# Patient Record
Sex: Male | Born: 1975 | Hispanic: No | Marital: Married | Smoking: Never smoker
Health system: Southern US, Community
[De-identification: ages and names within clinical notes are randomized; demographics above are authoritative.]

## PROBLEM LIST (undated history)

## (undated) DIAGNOSIS — T753XXA Motion sickness, initial encounter: Secondary | ICD-10-CM

## (undated) HISTORY — PX: NO PAST SURGERIES: SHX2092

---

## 2002-06-13 ENCOUNTER — Encounter: Payer: Self-pay | Admitting: Specialist

## 2002-06-13 ENCOUNTER — Encounter: Admission: RE | Admit: 2002-06-13 | Discharge: 2002-06-13 | Payer: Self-pay | Admitting: Specialist

## 2008-06-23 ENCOUNTER — Ambulatory Visit: Payer: Self-pay | Admitting: Internal Medicine

## 2015-08-10 ENCOUNTER — Encounter: Payer: Self-pay | Admitting: *Deleted

## 2015-08-16 NOTE — Discharge Instructions (Signed)
[facility] °[facility] °ENDOSCOPIC SINUS SURGERY °[facility] ° °What is Functional Endoscopic Sinus Surgery? ° The Surgery involves making the natural openings of the sinuses larger by removing the bony partitions that separate the sinuses from the nasal cavity.  The natural sinus lining is preserved as much as possible to allow the sinuses to resume normal function after the surgery.  In some patients nasal polyps (excessively swollen lining of the sinuses) may be removed to relieve obstruction of the sinus openings.  The surgery is performed through the nose using lighted scopes, which eliminates the need for incisions on the face.  A septoplasty is a different procedure which is sometimes performed with sinus surgery.  It involves straightening the boy partition that separates the two sides of your nose.  A crooked or deviated septum may need repair if is obstructing the sinuses or nasal airflow.  Turbinate reduction is also often performed during sinus surgery.  The turbinates are bony proturberances from the side walls of the nose which swell and can obstruct the nose in patients with sinus and allergy problems.  Their size can be surgically reduced to help relieve nasal obstruction. ° °What Can Sinus Surgery Do For Me? ° Sinus surgery can reduce the frequency of sinus infections requiring antibiotic treatment.  This can provide improvement in nasal congestion, post-nasal drainage, facial pressure and nasal obstruction.  Surgery will NOT prevent you from ever having an infection again, so it usually only for patients who get infections 4 or more times yearly requiring antibiotics, or for infections that do not clear with antibiotics.  It will not cure nasal allergies, so patients with allergies may still require medication to treat their allergies after surgery. Surgery may improve headaches related to sinusitis, however, some people will continue to  require medication to control sinus headaches related to allergies.  Surgery will do nothing for other forms of headache (migraine, tension or cluster). ° °What Are the Risks of Endoscopic Sinus Surgery? ° Current techniques allow surgery to be performed safely with little risk, however, there are rare complications that patients should be aware of.  Because the sinuses are located around the eyes, there is risk of eye injury, including blindness, though again, this would be quite rare. This is usually a result of bleeding behind the eye during surgery, which puts the vision oat risk, though there are treatments to protect the vision and prevent permanent disrupted by surgery causing a leak of the spinal fluid that surrounds the brain.  More serious complications would include bleeding inside the brain cavity or damage to the brain.  Again, all of these complications are uncommon, and spinal fluid leaks can be safely managed surgically if they occur.  The most common complication of sinus surgery is bleeding from the nose, which may require packing or cauterization of the nose.  Continued sinus have polyps may experience recurrence of the polyps requiring revision surgery.  Alterations of sense of smell or injury to the tear ducts are also rare complications.  ° °What is the Surgery Like, and what is the Recovery? ° The Surgery usually takes a couple of hours to perform, and is usually performed under a general anesthetic (completely asleep).  Patients are usually discharged home after a couple of hours.  Sometimes during surgery it is necessary to pack the nose to control bleeding, and the packing is left in place for 24 - 48 hours, and removed by your surgeon.    If a septoplasty was performed during the procedure, there is often a splint placed which must be removed after 5-7 days.   °Discomfort: Pain is usually mild to moderate, and can be controlled by prescription pain medication or acetaminophen (Tylenol).   Aspirin, Ibuprofen (Advil, Motrin), or Naprosyn (Aleve) should be avoided, as they can cause increased bleeding.  Most patients feel sinus pressure like they have a bad head cold for several days.  Sleeping with your head elevated can help reduce swelling and facial pressure, as can ice packs over the face.  A humidifier may be helpful to keep the mucous and blood from drying in the nose.  ° °Diet: There are no specific diet restrictions, however, you should generally start with clear liquids and a light diet of bland foods because the anesthetic can cause some nausea.  Advance your diet depending on how your stomach feels.  Taking your pain medication with food will often help reduce stomach upset which pain medications can cause. ° °Nasal Saline Irrigation: It is important to remove blood clots and dried mucous from the nose as it is healing.  This is done by having you irrigate the nose at least 3 - 4 times daily with a salt water solution.  We recommend using NeilMed Sinus Rinse (available at the drug store).  Fill the squeeze bottle with the solution, bend over a sink, and insert the tip of the squeeze bottle into the nose ½ of an inch.  Point the tip of the squeeze bottle towards the inside corner of the eye on the same side your irrigating.  Squeeze the bottle and gently irrigate the nose.  If you bend forward as you do this, most of the fluid will flow back out of the nose, instead of down your throat.   The solution should be warm, near body temperature, when you irrigate.   Each time you irrigate, you should use a full squeeze bottle.  ° °Note that if you are instructed to use Nasal Steroid Sprays at any time after your surgery, irrigate with saline BEFORE using the steroid spray, so you do not wash it all out of the nose. °Another product, Nasal Saline Gel (such as AYR Nasal Saline Gel) can be applied in each nostril 3 - 4 times daily to moisture the nose and reduce scabbing or crusting. ° °Bleeding:   Bloody drainage from the nose can be expected for several days, and patients are instructed to irrigate their nose frequently with salt water to help remove mucous and blood clots.  The drainage may be dark red or brown, though some fresh blood may be seen intermittently, especially after irrigation.  Do not blow you nose, as bleeding may occur. If you must sneeze, keep your mouth open to allow air to escape through your mouth. ° °If heavy bleeding occurs: Irrigate the nose with saline to rinse out clots, then spray the nose 3 - 4 times with Afrin Nasal Decongestant Spray.  The spray will constrict the blood vessels to slow bleeding.  Pinch the lower half of your nose shut to apply pressure, and lay down with your head elevated.  Ice packs over the nose may help as well. If bleeding persists despite these measures, you should notify your doctor.  Do not use the Afrin routinely to control nasal congestion after surgery, as it can result in worsening congestion and may affect healing.  ° ° ° °Activity: Return to work varies among patients. Most patients will be   out of work at least 5 - 7 days to recover.  Patient may return to work after they are off of narcotic pain medication, and feeling well enough to perform the functions of their job.  Patients must avoid heavy lifting (over 10 pounds) or strenuous physical for 2 weeks after surgery, so your employer may need to assign you to light duty, or keep you out of work longer if light duty is not possible.  NOTE: you should not drive, operate dangerous machinery, do any mentally demanding tasks or make any important legal or financial decisions while on narcotic pain medication and recovering from the general anesthetic.  °  °Call Your Doctor Immediately if You Have Any of the Following: °1. Bleeding that you cannot control with the above measures °2. Loss of vision, double vision, bulging of the eye or black eyes. °3. Fever over 101 degrees °4. Neck stiffness with  severe headache, fever, nausea and change in mental state. °You are always encourage to call anytime with concerns, however, please call with requests for pain medication refills during office hours. ° °Office Endoscopy: During follow-up visits your doctor will remove any packing or splints that may have been placed and evaluate and clean your sinuses endoscopically.  Topical anesthetic will be used to make this as comfortable as possible, though you may want to take your pain medication prior to the visit.  How often this will need to be done varies from patient to patient.  After complete recovery from the surgery, you may need follow-up endoscopy from time to time, particularly if there is concern of recurrent infection or nasal polyps. ° °General Anesthesia, Adult, Care After °Refer to this sheet in the next few weeks. These instructions provide you with information on caring for yourself after your procedure. Your health care provider may also give you more specific instructions. Your treatment has been planned according to current medical practices, but problems sometimes occur. Call your health care provider if you have any problems or questions after your procedure. °WHAT TO EXPECT AFTER THE PROCEDURE °After the procedure, it is typical to experience: °· Sleepiness. °· Nausea and vomiting. °HOME CARE INSTRUCTIONS °· For the first 24 hours after general anesthesia: °¨ Have a responsible person with you. °¨ Do not drive a car. If you are alone, do not take public transportation. °¨ Do not drink alcohol. °¨ Do not take medicine that has not been prescribed by your health care provider. °¨ Do not sign important papers or make important decisions. °¨ You may resume a normal diet and activities as directed by your health care provider. °· Change bandages (dressings) as directed. °· If you have questions or problems that seem related to general anesthesia, call the hospital and ask for the anesthetist or  anesthesiologist on call. °SEEK MEDICAL CARE IF: °· You have nausea and vomiting that continue the day after anesthesia. °· You develop a rash. °SEEK IMMEDIATE MEDICAL CARE IF:  °· You have difficulty breathing. °· You have chest pain. °· You have any allergic problems. °  °This information is not intended to replace advice given to you by your health care provider. Make sure you discuss any questions you have with your health care provider. °  °Document Released: 07/14/2000 Document Revised: 04/28/2014 Document Reviewed: 08/06/2011 °Elsevier Interactive Patient Education ©2016 [employer] ° °

## 2015-08-17 ENCOUNTER — Ambulatory Visit: Payer: Managed Care, Other (non HMO) | Admitting: Anesthesiology

## 2015-08-17 ENCOUNTER — Encounter
Admission: RE | Disposition: A | Discharge status: Home / Self Care | Payer: Self-pay | Source: Ambulatory Visit | Attending: Unknown Physician Specialty

## 2015-08-17 ENCOUNTER — Encounter: Payer: Self-pay | Admitting: *Deleted

## 2015-08-17 ENCOUNTER — Ambulatory Visit
Admission: RE | Admit: 2015-08-17 | Discharge: 2015-08-17 | Disposition: A | Discharge status: Home / Self Care | Payer: Managed Care, Other (non HMO) | Source: Ambulatory Visit | Attending: Unknown Physician Specialty | Admitting: Unknown Physician Specialty

## 2015-08-17 DIAGNOSIS — J343 Hypertrophy of nasal turbinates: Secondary | ICD-10-CM | POA: Diagnosis not present

## 2015-08-17 DIAGNOSIS — J321 Chronic frontal sinusitis: Secondary | ICD-10-CM | POA: Diagnosis not present

## 2015-08-17 DIAGNOSIS — J309 Allergic rhinitis, unspecified: Secondary | ICD-10-CM | POA: Diagnosis not present

## 2015-08-17 DIAGNOSIS — J342 Deviated nasal septum: Secondary | ICD-10-CM | POA: Insufficient documentation

## 2015-08-17 DIAGNOSIS — J320 Chronic maxillary sinusitis: Secondary | ICD-10-CM | POA: Diagnosis not present

## 2015-08-17 DIAGNOSIS — J322 Chronic ethmoidal sinusitis: Secondary | ICD-10-CM | POA: Diagnosis not present

## 2015-08-17 DIAGNOSIS — J329 Chronic sinusitis, unspecified: Secondary | ICD-10-CM | POA: Diagnosis present

## 2015-08-17 DIAGNOSIS — J323 Chronic sphenoidal sinusitis: Secondary | ICD-10-CM | POA: Insufficient documentation

## 2015-08-17 HISTORY — PX: SPHENOIDECTOMY: SHX2421

## 2015-08-17 HISTORY — PX: MAXILLARY ANTROSTOMY: SHX2003

## 2015-08-17 HISTORY — PX: SEPTOPLASTY: SHX2393

## 2015-08-17 HISTORY — PX: IMAGE GUIDED SINUS SURGERY: SHX6570

## 2015-08-17 HISTORY — DX: Motion sickness, initial encounter: T75.3XXA

## 2015-08-17 HISTORY — PX: NASAL TURBINATE REDUCTION: SHX2072

## 2015-08-17 HISTORY — PX: FRONTAL SINUS EXPLORATION: SHX6591

## 2015-08-17 HISTORY — PX: ETHMOIDECTOMY: SHX5197

## 2015-08-17 SURGERY — SEPTOPLASTY, NOSE
Anesthesia: General

## 2015-08-17 MED ORDER — SULFAMETHOXAZOLE-TRIMETHOPRIM 800-160 MG PO TABS
1.0000 | ORAL_TABLET | Freq: Two times a day (BID) | ORAL | Status: DC
Start: 2015-08-17 — End: 2017-02-10

## 2015-08-17 MED ORDER — DEXAMETHASONE SODIUM PHOSPHATE 4 MG/ML IJ SOLN
INTRAMUSCULAR | Status: DC | PRN
  Administered 2015-08-17: 10 mg via INTRAVENOUS

## 2015-08-17 MED ORDER — MIDAZOLAM HCL 5 MG/5ML IJ SOLN
INTRAMUSCULAR | Status: DC | PRN
  Administered 2015-08-17: 2 mg via INTRAVENOUS

## 2015-08-17 MED ORDER — PROMETHAZINE HCL 25 MG/ML IJ SOLN
6.2500 mg | INTRAMUSCULAR | Status: DC | PRN
Start: 2015-08-17 — End: 2015-08-17

## 2015-08-17 MED ORDER — LACTATED RINGERS IV SOLN
INTRAVENOUS | Status: DC
  Administered 2015-08-17 (×2): via INTRAVENOUS

## 2015-08-17 MED ORDER — OXYMETAZOLINE HCL 0.05 % NA SOLN
6.0000 | Freq: Once | NASAL | Status: AC
  Administered 2015-08-17: 6 via NASAL

## 2015-08-17 MED ORDER — ACETAMINOPHEN 10 MG/ML IV SOLN
1000.0000 mg | Freq: Once | INTRAVENOUS | Status: AC
  Administered 2015-08-17: 1000 mg via INTRAVENOUS

## 2015-08-17 MED ORDER — SCOPOLAMINE 1 MG/3DAYS TD PT72
1.0000 | MEDICATED_PATCH | TRANSDERMAL | Status: DC
  Administered 2015-08-17: 1.5 mg via TRANSDERMAL

## 2015-08-17 MED ORDER — MEPERIDINE HCL 25 MG/ML IJ SOLN: 6.2500 mg | INTRAMUSCULAR | Status: DC | PRN

## 2015-08-17 MED ORDER — ONDANSETRON HCL 4 MG/2ML IJ SOLN
INTRAMUSCULAR | Status: DC | PRN
  Administered 2015-08-17: 4 mg via INTRAVENOUS

## 2015-08-17 MED ORDER — ROCURONIUM BROMIDE 100 MG/10ML IV SOLN
INTRAVENOUS | Status: DC | PRN
  Administered 2015-08-17: 30 mg via INTRAVENOUS

## 2015-08-17 MED ORDER — OXYCODONE HCL 5 MG PO TABS
5.0000 mg | ORAL_TABLET | Freq: Once | ORAL | Status: AC | PRN
  Administered 2015-08-17: 5 mg via ORAL

## 2015-08-17 MED ORDER — OXYCODONE-ACETAMINOPHEN 5-325 MG PO TABS: 1.0000 | ORAL_TABLET | ORAL | Status: DC | PRN

## 2015-08-17 MED ORDER — LIDOCAINE HCL (CARDIAC) 20 MG/ML IV SOLN
INTRAVENOUS | Status: DC | PRN
  Administered 2015-08-17: 40 mg via INTRAVENOUS

## 2015-08-17 MED ORDER — OXYCODONE HCL 5 MG/5ML PO SOLN: 5.0000 mg | Freq: Once | ORAL | Status: AC | PRN

## 2015-08-17 MED ORDER — GLYCOPYRROLATE 0.2 MG/ML IJ SOLN
INTRAMUSCULAR | Status: DC | PRN
  Administered 2015-08-17: 0.1 mg via INTRAVENOUS

## 2015-08-17 MED ORDER — FENTANYL CITRATE (PF) 100 MCG/2ML IJ SOLN
INTRAMUSCULAR | Status: DC | PRN
  Administered 2015-08-17: 100 ug via INTRAVENOUS

## 2015-08-17 MED ORDER — HYDROMORPHONE HCL 1 MG/ML IJ SOLN: 0.2500 mg | INTRAMUSCULAR | Status: DC | PRN

## 2015-08-17 MED ORDER — PROPOFOL 10 MG/ML IV BOLUS
INTRAVENOUS | Status: DC | PRN
  Administered 2015-08-17: 20 mg via INTRAVENOUS
  Administered 2015-08-17: 150 mg via INTRAVENOUS

## 2015-08-17 SURGICAL SUPPLY — 35 items
BATTERY INSTRU NAVIGATION (MISCELLANEOUS) ×16 IMPLANT
BLADE SURG 15 STRL LF DISP TIS (BLADE) IMPLANT
BLADE SURG 15 STRL SS (BLADE)
BTRY SRG DRVR LF (MISCELLANEOUS) ×4
CANISTER SUCT 1200ML W/VALVE (MISCELLANEOUS) ×4 IMPLANT
COAG SUCT 10F 3.5MM HAND CTRL (MISCELLANEOUS) ×4 IMPLANT
CUP MEDICINE 2OZ PLAST GRAD ST (MISCELLANEOUS) ×4 IMPLANT
DRAPE HEAD BAR (DRAPES) ×4 IMPLANT
DRESSING NASL FOAM PST OP SINU (MISCELLANEOUS) ×4 IMPLANT
DRSG NASAL FOAM POST OP SINU (MISCELLANEOUS) ×8
GLOVE BIO SURGEON STRL SZ7.5 (GLOVE) ×8 IMPLANT
HANDLE YANKAUER SUCT BULB TIP (MISCELLANEOUS) ×4 IMPLANT
KIT ROOM TURNOVER OR (KITS) ×4 IMPLANT
NAVIGATION MASK REG  ST (MISCELLANEOUS) ×4 IMPLANT
NEEDLE HYPO 25GX1X1/2 BEV (NEEDLE) ×4 IMPLANT
NS IRRIG 500ML POUR BTL (IV SOLUTION) ×4 IMPLANT
PACK DRAPE NASAL/ENT (PACKS) ×4 IMPLANT
PAD GROUND ADULT SPLIT (MISCELLANEOUS) ×4 IMPLANT
SOL ANTI-FOG 6CC FOG-OUT (MISCELLANEOUS) ×2 IMPLANT
SOL FOG-OUT ANTI-FOG 6CC (MISCELLANEOUS) ×2
SPLINT NASAL SEPTAL BLV .25 LG (MISCELLANEOUS) ×4 IMPLANT
SPLINT NASAL SEPTAL BLV .50 ST (MISCELLANEOUS) ×4 IMPLANT
SPONGE NEURO XRAY DETECT 1X3 (DISPOSABLE) ×4 IMPLANT
STRAP BODY AND KNEE 60X3 (MISCELLANEOUS) ×4 IMPLANT
SUT CHROMIC 3-0 (SUTURE) ×3
SUT CHROMIC 3-0 KS 27XMFL CR (SUTURE) ×2
SUT CHROMIC 5-0 (SUTURE)
SUT CHROMIC 5-0 P2 18XMFL CR (SUTURE)
SUT ETHILON 3-0 KS 30 BLK (SUTURE) ×4 IMPLANT
SUT PLAIN GUT 4-0 (SUTURE) IMPLANT
SUTURE CHRMC 3-0 KS 27XMFL CR (SUTURE) ×2 IMPLANT
SUTURE CHRMC 5-0 P2 18XMF CR (SUTURE) IMPLANT
SYRINGE 10CC LL (SYRINGE) ×4 IMPLANT
TOWEL OR 17X26 4PK STRL BLUE (TOWEL DISPOSABLE) ×4 IMPLANT
WATER STERILE IRR 500ML POUR (IV SOLUTION) ×4 IMPLANT

## 2015-08-17 NOTE — Anesthesia Postprocedure Evaluation (Signed)
Anesthesia Post Note  Patient: Ethan Jackson  Procedure(s) Performed: Procedure(s) (LRB): SEPTOPLASTY (N/A) TURBINATE REDUCTION/SUBMUCOSAL RESECTION (Bilateral) ANT/POSTERIOR ETHMOIDECTOMY (Bilateral) MAXILLARY ANTROSTOMY (Bilateral) FRONTAL SINUS EXPLORATION (Bilateral) SPHENOIDECTOMY (Bilateral) IMAGE GUIDED SINUS SURGERY (N/A)  Patient location during evaluation: PACU Anesthesia Type: General Level of consciousness: awake and alert and oriented Pain management: pain level controlled Vital Signs Assessment: post-procedure vital signs reviewed and stable Respiratory status: spontaneous breathing and nonlabored ventilation Cardiovascular status: stable Postop Assessment: no signs of nausea or vomiting and adequate PO intake Anesthetic complications: no    Estill Batten

## 2015-08-17 NOTE — H&P (Signed)
  H+P  Reviewed and will be scanned in later. No changes noted. 

## 2015-08-17 NOTE — Transfer of Care (Signed)
Immediate Anesthesia Transfer of Care Note  Patient: Ethan Jackson  Procedure(s) Performed: Procedure(s) with comments: SEPTOPLASTY (N/A) - DISK GAVE DISK TO TABITHA ON 4-06 KP TURBINATE REDUCTION/SUBMUCOSAL RESECTION (Bilateral) ANT/POSTERIOR ETHMOIDECTOMY (Bilateral) MAXILLARY ANTROSTOMY (Bilateral) FRONTAL SINUS EXPLORATION (Bilateral) SPHENOIDECTOMY (Bilateral) IMAGE GUIDED SINUS SURGERY (N/A)  Patient Location: PACU  Anesthesia Type: General  Level of Consciousness: awake, alert  and patient cooperative  Airway and Oxygen Therapy: Patient Spontanous Breathing and Patient connected to supplemental oxygen  Post-op Assessment: Post-op Vital signs reviewed, Patient's Cardiovascular Status Stable, Respiratory Function Stable, Patent Airway and No signs of Nausea or vomiting  Post-op Vital Signs: Reviewed and stable  Complications: No apparent anesthesia complications

## 2015-08-17 NOTE — Op Note (Signed)
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  Post-op Dx: SAME  Proc: #1 use of Stryker navigation system #2 nasal septoplasty #3 bilateral endoscopic anterior posterior ethmoidectomy with removal of tissue complete #4 bilateral maxillary antrostomy with removal of tissue #5 bilateral sphenoidotomy with removal of tissue #6 bilateral frontal sinusotomy with removal of tissue #7 bilateral inferior turbinate submucous resection #8 mucous resection of middle turbinates bilaterally   Surg:  Ermin Parisien T  Anes:  GOT  EBL:  Less than 20 cc  Comp:  None  Findings:  Severe left posterior septal deformity and bilateral inferior turbinate hypertrophy thickened polypoid disease throughout the ethmoids maxillary sphenoid and frontal sinuses overhanging middle turbinate.  Procedure: Was a was identified in the holding area taken to the operating room and placed in supine position. The table is turned 90 the Stryker navigation device was applied and calibrated and remained on throughout the procedure. A topical anesthetic of phenylephrine lidocaine solution was placed within each nostril on cottonoid pledgets. After 5 minutes this was removed a local anesthetic of 1% lidocaine with 1 100,000 units of epinephrine was used to inject the nasal septum inferior turbinates middle turbinates and lateral nasal wall a total of 12 cc was used. The patient was then draped usual fashion for endoscopic sinus surgery the operation began with the nasal septoplasty. A hemitransfixion incision was created on the leading edge of the septum on the right-hand side. A subperichondrial plane was elevated posteriorly on the left back to the perpendicular plate of the ethmoid where subperiosteal plane was elevated. There was a large septal spur posteriorly incision was made through the septal cartilage just anterior to the perpendicular  plate of ethmoid and a subperichondrial plane was elevated on the right the curvature in the cartilage and the septal spur were removed. This allowed excellent reduction of the nasal deformity. A posterior inferior fenestration was created to prevent hematoma. This completed the operation then turned to the sinus surgery beginning on the left-hand side the 0 endoscope was introduced in the nose. The anterior tip of the middle turbinate was found to be enlarged and obscuring the ethmoid region. This was gently medialized the uncinate process was identified on the left using the Stryker navigation for identification the Cottle elevator was used to take down the uncinate process. The left maxillary sinus was opened widely using the straight and side biting forceps. As polypoid mucosa which was removed. The maxillary antrostomy opened widely the ethmoid bulla was identified. Using the Stryker navigation suction the anterior posterior ethmoids were opened and exonerated and cleaned of any polypoid disease. The frontal ethmoid area was probed there was polypoid disease up into the frontal duct which was opened into the frontal sinus. The anterior wall of the sphenoid was also identified using the Stryker navigation and direct visualization this was opened and the sphenoid sinus cleaned. I felt that to unable to clean the ethmoid cavity in the office that the middle turbinate would be instructing therefore the Micheline Maze were used to remove the anterior portion of the middle turbinate. This was then cauterized using suction cautery. With the left side completed attention was then turned to the right where again the 0 endoscope was used and the Stryker navigation. Again the middle turbinate was medialized the uncinate process was taken down the Cottle elevator. Max or sinus was opened and cleaned using straight and side biting forceps. Ethmoid  bulla again was identified and using the Stryker navigation suction the anterior  posterior ethmoids were cleaned straight and 45 forceps. The frontal ethmoid region again was cleaned using the 45 forceps opening up in the frontal sinus. Sphenoid sinus was opened on the right and cleaned as well. Again the middle turbinate was felt to be obstructing therefore the Micheline Maze were used to remove the anterior tip of the middle turbinate this was then cauterized using suction cautery. Both sides were then reexamined with the ethmoids sphenoid maxillary frontal sinuses widely patent operation then turned to the submucous section inferior turbinates. Beginning on the left-hand side a 15 blade was used to incise along the inferior edge of the inferior turbinate a superiorly laterally based flap was then elevated Knight scissors were used to excise the underlying conchal bone and mucosa. Flap was laid back over the chondral stop the inferior aspect was cauterized using the suction cautery for hemostasis. In similar fashion the right inferior turbinate had submucous resection performed. With no active bleeding the hemitransfixion incision was closed using 3-0 chromic standard septal splints were placed against each side of the septum and affixed to the septum using a 3-0 nylon. Stammberger gel was then used beneath each inferior turbinate and in the ethmoid cavities. The patient was in return anesthesia where he was awakened in the operating room taken recovery room in stable condition  Cultures: None   Specimens: Sinus contents    Dispo:   Good   Plan:  Discharged home on antibiotics and pain medication follow-up next week for stent removal   Shalon Salado T  08/17/2015 12:16 PM

## 2015-08-17 NOTE — Anesthesia Preprocedure Evaluation (Signed)
Anesthesia Evaluation  Patient identified by MRN, date of birth, ID band Patient awake    Reviewed: Allergy & Precautions, NPO status , Patient's Chart, lab work & pertinent test results  Airway Mallampati: II  TM Distance: >3 FB Neck ROM: Full    Dental no notable dental hx.    Pulmonary neg pulmonary ROS,    Pulmonary exam normal        Cardiovascular negative cardio ROS Normal cardiovascular exam     Neuro/Psych negative neurological ROS     GI/Hepatic negative GI ROS, Neg liver ROS,   Endo/Other  negative endocrine ROS  Renal/GU negative Renal ROS  negative genitourinary   Musculoskeletal negative musculoskeletal ROS (+)   Abdominal   Peds  Hematology negative hematology ROS (+)   Anesthesia Other Findings   Reproductive/Obstetrics                             Anesthesia Physical Anesthesia Plan  ASA: I  Anesthesia Plan: General   Post-op Pain Management:    Induction: Intravenous  Airway Management Planned: Oral ETT  Additional Equipment:   Intra-op Plan:   Post-operative Plan:   Informed Consent: I have reviewed the patients History and Physical, chart, labs and discussed the procedure including the risks, benefits and alternatives for the proposed anesthesia with the patient or authorized representative who has indicated his/her understanding and acceptance.     Plan Discussed with: CRNA  Anesthesia Plan Comments:         Anesthesia Quick Evaluation

## 2015-08-17 NOTE — Anesthesia Procedure Notes (Signed)
Procedure Name: Intubation Date/Time: 08/17/2015 11:00 AM Performed by: Mayme Genta Pre-anesthesia Checklist: Patient identified, Emergency Drugs available, Suction available, Patient being monitored and Timeout performed Patient Re-evaluated:Patient Re-evaluated prior to inductionOxygen Delivery Method: Circle system utilized Preoxygenation: Pre-oxygenation with 100% oxygen Intubation Type: IV induction Ventilation: Mask ventilation without difficulty Laryngoscope Size: Miller and 3 Grade View: Grade I Tube type: Oral Rae Tube size: 7.5 mm Number of attempts: 1 Placement Confirmation: ETT inserted through vocal cords under direct vision,  positive ETCO2 and breath sounds checked- equal and bilateral Tube secured with: Tape Dental Injury: Teeth and Oropharynx as per pre-operative assessment

## 2015-08-20 ENCOUNTER — Encounter: Payer: Self-pay | Admitting: Unknown Physician Specialty

## 2015-08-21 LAB — SURGICAL PATHOLOGY

## 2017-02-06 ENCOUNTER — Encounter: Payer: Self-pay | Admitting: Emergency Medicine

## 2017-02-06 ENCOUNTER — Inpatient Hospital Stay
Admission: EM | Admit: 2017-02-06 | Discharge: 2017-02-10 | DRG: 392 | Disposition: A | Discharge status: Home / Self Care | Payer: Self-pay | Attending: Internal Medicine | Admitting: Internal Medicine

## 2017-02-06 ENCOUNTER — Emergency Department: Payer: Self-pay

## 2017-02-06 DIAGNOSIS — K627 Radiation proctitis: Secondary | ICD-10-CM | POA: Diagnosis present

## 2017-02-06 DIAGNOSIS — Z7952 Long term (current) use of systemic steroids: Secondary | ICD-10-CM

## 2017-02-06 DIAGNOSIS — K529 Noninfective gastroenteritis and colitis, unspecified: Principal | ICD-10-CM | POA: Diagnosis present

## 2017-02-06 DIAGNOSIS — T66XXXA Radiation sickness, unspecified, initial encounter: Secondary | ICD-10-CM

## 2017-02-06 DIAGNOSIS — Z79899 Other long term (current) drug therapy: Secondary | ICD-10-CM

## 2017-02-06 DIAGNOSIS — K6289 Other specified diseases of anus and rectum: Secondary | ICD-10-CM

## 2017-02-06 DIAGNOSIS — R339 Retention of urine, unspecified: Secondary | ICD-10-CM | POA: Diagnosis present

## 2017-02-06 LAB — COMPREHENSIVE METABOLIC PANEL
ALT: 40 U/L (ref 17–63)
ANION GAP: 12 (ref 5–15)
AST: 33 U/L (ref 15–41)
Albumin: 4.6 g/dL (ref 3.5–5.0)
Alkaline Phosphatase: 87 U/L (ref 38–126)
BILIRUBIN TOTAL: 0.9 mg/dL (ref 0.3–1.2)
BUN: 12 mg/dL (ref 6–20)
CHLORIDE: 100 mmol/L — AB (ref 101–111)
CO2: 24 mmol/L (ref 22–32)
Calcium: 9.5 mg/dL (ref 8.9–10.3)
Creatinine, Ser: 0.86 mg/dL (ref 0.61–1.24)
GFR calc Af Amer: >60 mL/min (ref >60)
Glucose, Bld: 103 mg/dL — ABNORMAL HIGH (ref 65–99)
POTASSIUM: 3.9 mmol/L (ref 3.5–5.1)
Sodium: 136 mmol/L (ref 135–145)
TOTAL PROTEIN: 7.9 g/dL (ref 6.5–8.1)

## 2017-02-06 LAB — URINALYSIS, COMPLETE (UACMP) WITH MICROSCOPIC
Bacteria, UA: NONE SEEN
Bilirubin Urine: NEGATIVE
GLUCOSE, UA: NEGATIVE mg/dL
HGB URINE DIPSTICK: NEGATIVE
Ketones, ur: 20 mg/dL — AB
LEUKOCYTES UA: NEGATIVE
NITRITE: NEGATIVE
PH: 6 (ref 5.0–8.0)
PROTEIN: NEGATIVE mg/dL
RBC / HPF: NONE SEEN RBC/hpf (ref 0–5)
SQUAMOUS EPITHELIAL / LPF: NONE SEEN
Specific Gravity, Urine: 1.01 (ref 1.005–1.030)
WBC, UA: NONE SEEN WBC/hpf (ref 0–5)

## 2017-02-06 LAB — CBC
HEMATOCRIT: 45.4 % (ref 40.0–52.0)
HEMOGLOBIN: 15 g/dL (ref 13.0–18.0)
MCH: 27.7 pg (ref 26.0–34.0)
MCHC: 33.1 g/dL (ref 32.0–36.0)
MCV: 83.7 fL (ref 80.0–100.0)
Platelets: 323 10*3/uL (ref 150–440)
RBC: 5.42 MIL/uL (ref 4.40–5.90)
RDW: 13.4 % (ref 11.5–14.5)
WBC: 16 10*3/uL — ABNORMAL HIGH (ref 3.8–10.6)

## 2017-02-06 LAB — TYPE AND SCREEN
ABO/RH(D): O POS
ANTIBODY SCREEN: NEGATIVE

## 2017-02-06 MED ORDER — HYDROMORPHONE HCL 1 MG/ML IJ SOLN
1.0000 mg | INTRAMUSCULAR | Status: DC | PRN
  Administered 2017-02-06 – 2017-02-08 (×4): 1 mg via INTRAVENOUS
  Filled 2017-02-06 (×4): qty 1

## 2017-02-06 MED ORDER — CIPROFLOXACIN IN D5W 400 MG/200ML IV SOLN
400.0000 mg | Freq: Two times a day (BID) | INTRAVENOUS | Status: DC
  Administered 2017-02-07 – 2017-02-08 (×3): 400 mg via INTRAVENOUS
  Filled 2017-02-06 (×4): qty 200

## 2017-02-06 MED ORDER — MORPHINE SULFATE (PF) 4 MG/ML IV SOLN
4.0000 mg | Freq: Once | INTRAVENOUS | Status: AC
  Administered 2017-02-06: 4 mg via INTRAVENOUS
  Filled 2017-02-06: qty 1

## 2017-02-06 MED ORDER — ONDANSETRON HCL 4 MG/2ML IJ SOLN
4.0000 mg | Freq: Once | INTRAMUSCULAR | Status: AC
  Administered 2017-02-06: 4 mg via INTRAVENOUS
  Filled 2017-02-06: qty 2

## 2017-02-06 MED ORDER — SODIUM CHLORIDE 0.9 % IV SOLN
INTRAVENOUS | Status: DC
  Administered 2017-02-06: 18:00:00 via INTRAVENOUS
  Administered 2017-02-08: 1000 mL via INTRAVENOUS

## 2017-02-06 MED ORDER — ACETAMINOPHEN 325 MG PO TABS: 650.0000 mg | ORAL_TABLET | Freq: Four times a day (QID) | ORAL | Status: DC | PRN

## 2017-02-06 MED ORDER — METRONIDAZOLE IN NACL 5-0.79 MG/ML-% IV SOLN
500.0000 mg | Freq: Three times a day (TID) | INTRAVENOUS | Status: DC
  Administered 2017-02-07 – 2017-02-08 (×4): 500 mg via INTRAVENOUS
  Filled 2017-02-06 (×8): qty 100

## 2017-02-06 MED ORDER — ONDANSETRON HCL 4 MG PO TABS: 4.0000 mg | ORAL_TABLET | Freq: Four times a day (QID) | ORAL | Status: DC | PRN

## 2017-02-06 MED ORDER — PIPERACILLIN-TAZOBACTAM 3.375 G IVPB 30 MIN
3.3750 g | Freq: Once | INTRAVENOUS | Status: AC
  Administered 2017-02-06: 3.375 g via INTRAVENOUS
  Filled 2017-02-06: qty 50

## 2017-02-06 MED ORDER — ENOXAPARIN SODIUM 40 MG/0.4ML ~~LOC~~ SOLN
40.0000 mg | SUBCUTANEOUS | Status: DC
  Administered 2017-02-07 – 2017-02-08 (×2): 40 mg via SUBCUTANEOUS
  Filled 2017-02-06 (×2): qty 0.4

## 2017-02-06 MED ORDER — ONDANSETRON HCL 4 MG/2ML IJ SOLN
4.0000 mg | Freq: Four times a day (QID) | INTRAMUSCULAR | Status: DC | PRN
  Administered 2017-02-07 – 2017-02-08 (×2): 4 mg via INTRAVENOUS
  Filled 2017-02-06 (×2): qty 2

## 2017-02-06 MED ORDER — ACETAMINOPHEN 650 MG RE SUPP
650.0000 mg | Freq: Four times a day (QID) | RECTAL | Status: DC | PRN
Start: 2017-02-06 — End: 2017-02-10

## 2017-02-06 MED ORDER — SODIUM CHLORIDE 0.9 % IV BOLUS (SEPSIS)
1000.0000 mL | Freq: Once | INTRAVENOUS | Status: AC
  Administered 2017-02-06: 1000 mL via INTRAVENOUS

## 2017-02-06 NOTE — ED Notes (Signed)
Attempt to call report.

## 2017-02-06 NOTE — Progress Notes (Signed)
Patient was admitted to room 137 from ED. A&O x4. IV fluids started. Oriented to room, call light, TV and bed controls. Reviewed POC.

## 2017-02-06 NOTE — ED Triage Notes (Signed)
Pt presents with rectal bleeding started around 3am and has had about 15 episodes of same.

## 2017-02-06 NOTE — ED Notes (Signed)
Pt attempted to urinate, unable to provide sample at this time.

## 2017-02-06 NOTE — Progress Notes (Signed)
Gi doctor called and gave order for gi panel and c-diff test.-Dr. Marius Ditch

## 2017-02-06 NOTE — ED Notes (Signed)
Roselyn Reef, RN.

## 2017-02-06 NOTE — Progress Notes (Signed)
Pharmacy Antibiotic Note  Ethan Jackson is a 41 y.o. male admitted on 02/06/2017 with intraabdominal infection.  Pharmacy has been consulted for ciprofloxacin dosing. Patient is also ordered metronidazole.   Plan: Ciprofloxacin 400 mg iv q 12 hours.   Weight: 190 lb (86.2 kg)  Temp (24hrs), Avg:99.6 F (37.6 C), Min:99.6 F (37.6 C), Max:99.6 F (37.6 C)   Recent Labs Lab 02/06/17 1146  WBC 16.0*  CREATININE 0.86    CrCl cannot be calculated (Unknown ideal weight.).    No Known Allergies  Antimicrobials this admission: Ciprofloxacin 10/19 >> Metronidazole 10/19 >>  Dose adjustments this admission:  Microbiology results:  Thank you for allowing pharmacy to be a part of this patient's care.  [name] 02/06/2017 3:59 PM

## 2017-02-06 NOTE — H&P (Signed)
[facility] [place] NAME: Ethan Jackson    MR#:  [id number]  DATE OF BIRTH:  Aug 27, 1975  DATE OF ADMISSION:  02/06/2017  PRIMARY CARE PHYSICIAN: Patient, No Pcp Per   REQUESTING/REFERRING PHYSICIAN: Dr. Burlene Arnt  CHIEF COMPLAINT:severe abdominal pain acrossthe lower quadrant of abdomen   Chief Complaint  Patient presents with  . GI Bleeding  . Abdominal Pain    HISTORY OF PRESENT ILLNESS:  Ethan Jackson  is a 41 y.o. male with past medical history brought in because of severe abdominal pain across the lower abdomen and right lower nausea,blood in stool roll times today, CAT scan of abdomen done in the emergency room showed severe rectosigmoid colitis, elevated WBC up to 19. Patient also found to have low-grade temperature 99.6.  He  denies any history of for inflammatory bowel disease or diverticulitis.  PAST MEDICAL HISTORY:   Past Medical History:  Diagnosis Date  . Motion sickness    amusement park rides    PAST SURGICAL HISTOIRY:   Past Surgical History:  Procedure Laterality Date  . ETHMOIDECTOMY Bilateral 08/17/2015   Procedure: ANT/POSTERIOR ETHMOIDECTOMY;  Surgeon: Beverly Gust, MD;  Location: [place];  Service: ENT;  Laterality: Bilateral;  . FRONTAL SINUS EXPLORATION Bilateral 08/17/2015   Procedure: FRONTAL SINUS EXPLORATION;  Surgeon: Beverly Gust, MD;  Location: [place];  Service: ENT;  Laterality: Bilateral;  . IMAGE GUIDED SINUS SURGERY N/A 08/17/2015   Procedure: IMAGE GUIDED SINUS SURGERY;  Surgeon: Beverly Gust, MD;  Location: [place];  Service: ENT;  Laterality: N/A;  . MAXILLARY ANTROSTOMY Bilateral 08/17/2015   Procedure: MAXILLARY ANTROSTOMY;  Surgeon: Beverly Gust, MD;  Location: [place];  Service: ENT;  Laterality: Bilateral;  . NASAL TURBINATE REDUCTION Bilateral 08/17/2015   Procedure: TURBINATE REDUCTION/SUBMUCOSAL RESECTION;   Surgeon: Beverly Gust, MD;  Location: [place];  Service: ENT;  Laterality: Bilateral;  . NO PAST SURGERIES    . SEPTOPLASTY N/A 08/17/2015   Procedure: SEPTOPLASTY;  Surgeon: Beverly Gust, MD;  Location: [place];  Service: ENT;  Laterality: N/A;  DISK GAVE DISK TO TABITHA ON 4-06 KP  . SPHENOIDECTOMY Bilateral 08/17/2015   Procedure: SPHENOIDECTOMY;  Surgeon: Beverly Gust, MD;  Location: [place];  Service: ENT;  Laterality: Bilateral;    SOCIAL HISTORY:   Social History  Substance Use Topics  . Smoking status: Never Smoker  . Smokeless tobacco: Never Used  . Alcohol use No    FAMILY HISTORY:  No family history on file.  DRUG ALLERGIES:  No Known Allergies  REVIEW OF SYSTEMS:  CONSTITUTIONAL: low-grade fever EYES: No blurred or double vision.  EARS, NOSE, AND THROAT: No tinnitus or ear pain.  RESPIRATORY: No cough, shortness of breath, wheezing or hemoptysis.  CARDIOVASCULAR: No chest pain, orthopnea, edema.  GASTROINTESTINAL: Abdominal pain, rectal bleed GENITOURINARY: No dysuria, hematuria.  ENDOCRINE: No polyuria, nocturia,  HEMATOLOGY: No anemia, easy bruising or bleeding SKIN: No rash or lesion. MUSCULOSKELETAL: No joint pain or arthritis.   NEUROLOGIC: No tingling, numbness, weakness.  PSYCHIATRY: No anxiety or depression.   MEDICATIONS AT HOME:   Prior to Admission medications   Medication Sig Start Date End Date Taking? Authorizing Provider  oxyCODONE-acetaminophen (ROXICET) 5-325 MG tablet Take 1-2 tablets by mouth every 4 (four) hours as needed for severe pain. Patient not taking: Reported on 02/06/2017 08/17/15   Beverly Gust, MD  sulfamethoxazole-trimethoprim (BACTRIM DS,SEPTRA DS) 800-160 MG tablet Take 1  tablet by mouth 2 (two) times daily. Patient not taking: Reported on 02/06/2017 08/17/15   Beverly Gust, MD      VITAL SIGNS:  Blood pressure 122/72, pulse 81, temperature 99.6 F (37.6 C), temperature  source Tympanic, resp. rate 19, weight 86.2 kg (190 lb), SpO2 96 %.  PHYSICAL EXAMINATION:  GENERAL:  41 y.o.-year-old patient lying in the bed with no acute distress.  EYES: Pupils equal, round, reactive to light  No scleral icterus. Extraocular muscles intact.  HEENT: Head atraumatic, normocephalic. Oropharynx and nasopharynx clear.  NECK:  Supple, no jugular venous distention. No thyroid enlargement, no tenderness.  LUNGS: Normal breath sounds bilaterally, no wheezing, rales,rhonchi or crepitation. No use of accessory muscles of respiration.  CARDIOVASCULAR: S1, S2 normal. No murmurs, rubs, or gallops.  ABDOMEN: Soft, lower quadrant tenderness present, no rebound tenderness, bowel sounds present EXTREMITIES: No pedal edema, cyanosis, or clubbing.  NEUROLOGIC: Cranial nerves II through XII are intact. Muscle strength 5/5 in all extremities. Sensation intact. Gait not checked.  PSYCHIATRIC: The patient is alert and oriented x 3.  SKIN: No obvious rash, lesion, or ulcer.   LABORATORY PANEL:   CBC  Recent Labs Lab 02/06/17 1146  WBC 16.0*  HGB 15.0  HCT 45.4  PLT 323   ------------------------------------------------------------------------------------------------------------------  Chemistries   Recent Labs Lab 02/06/17 1146  NA 136  K 3.9  CL 100*  CO2 24  GLUCOSE 103*  BUN 12  CREATININE 0.86  CALCIUM 9.5  AST 33  ALT 40  ALKPHOS 87  BILITOT 0.9   ------------------------------------------------------------------------------------------------------------------  Cardiac Enzymes No results for input(s): TROPONINI in the last 168 hours. ------------------------------------------------------------------------------------------------------------------  RADIOLOGY:  Ct Renal Stone Study  Result Date: 02/06/2017 CLINICAL DATA:  LEFT abdominal and pelvic pain, rectal bleeding. EXAM: CT ABDOMEN AND PELVIS WITHOUT CONTRAST TECHNIQUE: Multidetector CT imaging of the  abdomen and pelvis was performed following the standard protocol without IV contrast. COMPARISON:  None. FINDINGS: LOWER CHEST: Lung bases are clear. The visualized heart size is normal. No pericardial effusion. HEPATOBILIARY: Normal. PANCREAS: Normal. SPLEEN: Punctate splenic granuloma, spleen is otherwise unremarkable. ADRENALS/URINARY TRACT: Kidneys are orthotopic, demonstrating normal size and morphology. No nephrolithiasis, hydronephrosis; limited assessment for renal masses on this nonenhanced examination. The unopacified ureters are normal in course and caliber. Urinary bladder is partially distended and unremarkable. Normal adrenal glands. STOMACH/BOWEL: Rectosigmoid marked circumferential wall thickening and pericolonic inflammation. The stomach, small bowel are normal in course and caliber without inflammatory changes, sensitivity decreased by lack of enteric contrast. A few suspected small bowel diverticulum. Normal appendix. VASCULAR/LYMPHATIC: Aortoiliac vessels are normal in course and caliber. No lymphadenopathy by CT size criteria. REPRODUCTIVE: Normal. OTHER: No intraperitoneal free fluid or free air. Presacral fat stranding. MUSCULOSKELETAL: Non-acute. Small fat containing umbilical hernia. Mild broad levoscoliosis. L5 limbus vertebra. Scattered old Schmorl's nodes. 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 Electronically Signed   By: Elon Alas M.D.   On: 02/06/2017 14:49    EKG:  No orders found for this or any previous visit.  IMPRESSION AND PLAN:   #41 year old male patient with abdominal pain low-grade temperature, rectal bleed: CAT scan of the abdomen concerning for rectosigmoid colitis. Admitted to medical service, start IV antibiotics with Cipro, Flagyl, continue IV fluids, keep nothing by mouth, GI consult evaluation of possible IBD.   All the records are reviewed and case discussed with ED  provider. Management plans discussed with the patient, family and they are in agreement.  CODE  STATUS: full  TOTAL TIME TAKING CARE OF THIS PATIENT: 555 minutes.    Epifanio Lesches M.D on 02/06/2017 at 4:08 PM  Between 7am to 6pm - Pager - [phone]  After 6pm go to [email or web address] - password [email or web address] [facility]  Office  [phone]  CC: Primary care physician; Patient, No Pcp Per  Note: This dictation was prepared with Dragon dictation along with smaller phrase technology. Any transcriptional errors that result from this process are unintentional.

## 2017-02-06 NOTE — ED Provider Notes (Addendum)
[facility]  ____________________________________________   I have reviewed the triage vital signs and the nursing notes.   HISTORY  Chief Complaint GI Bleeding and Abdominal Pain    HPI Ethan Jackson is a 41 y.o. male  Who is healthy, states that for the last couple days she's been having severe lower abdominal pain, he states that he has been having bloody stool for the last few days. He has never had this before. Denies history of surgery. No fever no chills. Positive nausea no vomiting. Pain is significant. He denies hematemesis. Nothing makes pain better, nothing makes it worse, he has had somewhat loose stools over the last few days.   Past Medical History:  Diagnosis Date  . Motion sickness    amusement park rides    There are no active problems to display for this patient.   Past Surgical History:  Procedure Laterality Date  . ETHMOIDECTOMY Bilateral 08/17/2015   Procedure: ANT/POSTERIOR ETHMOIDECTOMY;  Surgeon: Beverly Gust, MD;  Location: [place];  Service: ENT;  Laterality: Bilateral;  . FRONTAL SINUS EXPLORATION Bilateral 08/17/2015   Procedure: FRONTAL SINUS EXPLORATION;  Surgeon: Beverly Gust, MD;  Location: [place];  Service: ENT;  Laterality: Bilateral;  . IMAGE GUIDED SINUS SURGERY N/A 08/17/2015   Procedure: IMAGE GUIDED SINUS SURGERY;  Surgeon: Beverly Gust, MD;  Location: [place];  Service: ENT;  Laterality: N/A;  . MAXILLARY ANTROSTOMY Bilateral 08/17/2015   Procedure: MAXILLARY ANTROSTOMY;  Surgeon: Beverly Gust, MD;  Location: [place];  Service: ENT;  Laterality: Bilateral;  . NASAL TURBINATE REDUCTION Bilateral 08/17/2015   Procedure: TURBINATE REDUCTION/SUBMUCOSAL RESECTION;  Surgeon: Beverly Gust, MD;  Location: [place];  Service: ENT;  Laterality: Bilateral;  . NO PAST SURGERIES    . SEPTOPLASTY N/A  08/17/2015   Procedure: SEPTOPLASTY;  Surgeon: Beverly Gust, MD;  Location: [place];  Service: ENT;  Laterality: N/A;  DISK GAVE DISK TO TABITHA ON 4-06 KP  . SPHENOIDECTOMY Bilateral 08/17/2015   Procedure: SPHENOIDECTOMY;  Surgeon: Beverly Gust, MD;  Location: [place];  Service: ENT;  Laterality: Bilateral;    Prior to Admission medications   Medication Sig Start Date End Date Taking? Authorizing Provider  cetirizine (ZYRTEC) 10 MG tablet Take 10 mg by mouth daily.    [provider]  oxyCODONE-acetaminophen (ROXICET) 5-325 MG tablet Take 1-2 tablets by mouth every 4 (four) hours as needed for severe pain. 08/17/15   Beverly Gust, MD  predniSONE (DELTASONE) 10 MG tablet Take 10 mg by mouth daily.    [provider]  sulfamethoxazole-trimethoprim (BACTRIM DS,SEPTRA DS) 800-160 MG tablet Take 1 tablet by mouth 2 (two) times daily. 08/17/15   Beverly Gust, MD    Allergies Patient has no known allergies.  No family history on file.  Social History Social History  Substance Use Topics  . Smoking status: Never Smoker  . Smokeless tobacco: Never Used  . Alcohol use No    Review of Systems Constitutional: No fever/chills Eyes: No visual changes. ENT: No sore throat. No stiff neck no neck pain Cardiovascular: Denies chest pain. Respiratory: Denies shortness of breath. Gastrointestinal:  see history of present illness Genitourinary: Negative for dysuria. Musculoskeletal: Negative lower extremity swelling Skin: Negative for rash. Neurological: Negative for severe headaches, focal weakness or numbness.   ____________________________________________   PHYSICAL EXAM:  VITAL SIGNS: ED Triage Vitals  Enc Vitals Group     BP 02/06/17  1148 124/74     Pulse Rate 02/06/17 1148 86     Resp 02/06/17 1148 20     Temp 02/06/17 1148 99.6 F (37.6 C)     Temp Source 02/06/17 1148 Tympanic     SpO2 02/06/17 1148 98 %     Weight  02/06/17 1149 190 lb (86.2 kg)     Height --      Head Circumference --      Peak Flow --      Pain Score 02/06/17 1148 8     Pain Loc --      Pain Edu? --      Excl. in [place]? --     Constitutional: Alert and oriented. patient appears very uncomfortable Eyes: Conjunctivae are normal Head: Atraumatic HEENT: No congestion/rhinnorhea. Mucous membranes are moist.  Oropharynx non-erythematous Neck:   Nontender with no meningismus, no masses, no stridor Cardiovascular: Normal rate, regular rhythm. Grossly normal heart sounds.  Good peripheral circulation. Respiratory: Normal respiratory effort.  No retractions. Lungs CTAB. rectal exam: Very tender rectal exam, guaiac positive red blood present. Abdominal: Soft and nontender. No distention. No guarding no rebound Back:  There is no focal tenderness or step off.  there is no midline tenderness there are no lesions noted. there is no CVA tenderness GU: Nontender Musculoskeletal: No lower extremity tenderness, no upper extremity tenderness. No joint effusions, no DVT signs strong distal pulses no edema Neurologic:  Normal speech and language. No gross focal neurologic deficits are appreciated.  Skin:  Skin is warm, dry and intact. No rash noted. Psychiatric: Mood and affect are normal. Speech and behavior are normal.  ____________________________________________   LABS (all labs ordered are listed, but only abnormal results are displayed)  Labs Reviewed  COMPREHENSIVE METABOLIC PANEL - Abnormal; Notable for the following:       Result Value   Chloride 100 (*)    Glucose, Bld 103 (*)    All other components within normal limits  CBC - Abnormal; Notable for the following:    WBC 16.0 (*)    All other components within normal limits  URINALYSIS, COMPLETE (UACMP) WITH MICROSCOPIC  POC OCCULT BLOOD, ED  TYPE AND SCREEN    Pertinent labs  results that were available during my care of the patient were reviewed by me and considered in my  medical decision making (see chart for details). ____________________________________________  EKG  I personally interpreted any EKGs ordered by me or triage  ____________________________________________  RADIOLOGY  Pertinent labs & imaging results that were available during my care of the patient were reviewed by me and considered in my medical decision making (see chart for details). If possible, patient and/or family made aware of any abnormal findings. ____________________________________________    PROCEDURES  Procedure(s) performed: None  Procedures  Critical Care performed: None  ____________________________________________   INITIAL IMPRESSION / ASSESSMENT AND PLAN / ED COURSE  Pertinent labs & imaging results that were available during my care of the patient were reviewed by me and considered in my medical decision making (see chart for details).  she was rectal bleeding which is guaiac positive. Liver and reassuring vital signs reassuring white count is elevated, very tender rectal exam, CT scan shows a severe proctitis of unclear etiology, patient receiving pain medication and IV fluid and nausea medication. My concern is that this is a significant inflammatory event and probablywill require admission and GI workup  ----------------------------------------- 3:28 PM on 02/06/2017 -----------------------------------------  given severe proctitis we will  admit with antibiotics. Patient states he has no foreign bodies or sexual activity in his rectum he does however state that after he has a bowel movement he does use to fingers to "clean" his bottom by putting his fingers in his rectum. This he has been doing for some time apparently. Certainly this could cause an increased likelihood of infection. We are giving Zosyn. Hospitalist is aware. There is a person in the room with the patient and I have asked him to step out when I speak to the patient and this has all been  kept in strict privacy    ____________________________________________   FINAL CLINICAL IMPRESSION(S) / ED DIAGNOSES  Final diagnoses:  None      This chart was dictated using voice recognition software.  Despite best efforts to proofread,  errors can occur which can change meaning.       Schuyler Amor, MD 02/06/17 1511    Schuyler Amor, MD 02/06/17 1530    Schuyler Amor, MD 02/08/17 1105

## 2017-02-07 ENCOUNTER — Encounter: Payer: Self-pay | Admitting: Anesthesiology

## 2017-02-07 DIAGNOSIS — K529 Noninfective gastroenteritis and colitis, unspecified: Secondary | ICD-10-CM | POA: Diagnosis present

## 2017-02-07 DIAGNOSIS — K51511 Left sided colitis with rectal bleeding: Secondary | ICD-10-CM

## 2017-02-07 LAB — BASIC METABOLIC PANEL
Anion gap: 8 (ref 5–15)
BUN: 13 mg/dL (ref 6–20)
CALCIUM: 8.6 mg/dL — AB (ref 8.9–10.3)
CO2: 25 mmol/L (ref 22–32)
Chloride: 105 mmol/L (ref 101–111)
Creatinine, Ser: 0.88 mg/dL (ref 0.61–1.24)
GFR calc non Af Amer: >60 mL/min (ref >60)
Glucose, Bld: 86 mg/dL (ref 65–99)
Potassium: 3.7 mmol/L (ref 3.5–5.1)
Sodium: 138 mmol/L (ref 135–145)

## 2017-02-07 LAB — GASTROINTESTINAL PANEL BY PCR, STOOL (REPLACES STOOL CULTURE)

## 2017-02-07 LAB — CBC
HCT: 40.9 % (ref 40.0–52.0)
Hemoglobin: 14 g/dL (ref 13.0–18.0)
MCH: 28.7 pg (ref 26.0–34.0)
MCHC: 34.4 g/dL (ref 32.0–36.0)
MCV: 83.5 fL (ref 80.0–100.0)
PLATELETS: 266 10*3/uL (ref 150–440)
RBC: 4.9 MIL/uL (ref 4.40–5.90)
RDW: 13.5 % (ref 11.5–14.5)
WBC: 15.7 10*3/uL — ABNORMAL HIGH (ref 3.8–10.6)

## 2017-02-07 LAB — SEDIMENTATION RATE: Sed Rate: 6 mm/hr (ref 0–15)

## 2017-02-07 LAB — C DIFFICILE QUICK SCREEN W PCR REFLEX
C DIFFICILE (CDIFF) INTERP: NOT DETECTED
C DIFFICILE (CDIFF) TOXIN: NEGATIVE
C Diff antigen: NEGATIVE

## 2017-02-07 LAB — GLUCOSE, CAPILLARY: Glucose-Capillary: 79 mg/dL (ref 65–99)

## 2017-02-07 MED ORDER — PEG 3350-KCL-NA BICARB-NACL 420 G PO SOLR
4000.0000 mL | Freq: Once | ORAL | Status: AC
  Administered 2017-02-07: 4000 mL via ORAL
  Filled 2017-02-07: qty 4000

## 2017-02-07 NOTE — Progress Notes (Addendum)
Notified DR. Konidena for order. Receive  In and out order In and out cath done, 914m removed. Stated he felt better. No bleeding noted.

## 2017-02-07 NOTE — Progress Notes (Signed)
C. Diff result negative.

## 2017-02-07 NOTE — Consult Note (Addendum)
Ethan Darby, MD [address]  [place], [place] [address]  Main: [phone]  Fax: [phone] Pager: [phone]   Consultation  Referring Provider:     No ref. provider found Primary Care Physician:  Patient, No Pcp Per Primary Gastroenterologist:  Dr. Sherri Sear         Reason for Consultation:     Abdominal pain, rectal bleeding  Date of Admission:  02/06/2017 Date of Consultation:  02/07/2017         HPI:   Ethan Jackson is a 41 y.o. male Spanish-speaking, with no past medical historySpanish-speaking, with no significant past medical history presents with one-day history of several episodes of fresh blood per rectum associated with some clots and severe lower abdominal pain and unable to urinate. In the ER he was found to have significant leukocytosis, CT abdomen/pelvis revealed significant rectosigmoid thickening concerning inflammatory bowel disease. He reports that he has been having intermittent rectal bleeding for the last 1-2 years but not as severe as the current episode. He did not have abdominal pain in the past. He had low-grade fevers. He denies any recent travel to [place] or outside [place]. He denies any other GI symptoms including nausea, vomiting, loss of appetite or weight loss or other extraintestinal manifestations. He denies taking NSAIDs, denies tobacco or alcohol use or any other illicit drug use. He is married, and has a daughter. He works as a Nature conservation officer. Unknown HIV status. He denies recent antibiotic use.  He says, he is unable to urinate today and reports significant suprapubic discomfort He did not have EGD or a colonoscopy before Denies any family history of inflammatory bowel disease or other GI conditions including malignancy He did not have abdominal surgeries or any GI surgeries in the past  Diagnosis Date  . Motion sickness    amusement park rides    Past Surgical History:  Procedure Laterality  Date  . ETHMOIDECTOMY Bilateral 08/17/2015   Procedure: ANT/POSTERIOR ETHMOIDECTOMY;  Surgeon: Beverly Gust, MD;  Location: [facility];  Service: ENT;  Laterality: Bilateral;  . FRONTAL SINUS EXPLORATION Bilateral 08/17/2015   Procedure: FRONTAL SINUS EXPLORATION;  Surgeon: Beverly Gust, MD;  Location: [facility] [facility];  Service: ENT;  Laterality: Bilateral;  . IMAGE GUIDED SINUS SURGERY N/A 08/17/2015   Procedure: IMAGE GUIDED SINUS SURGERY;  Surgeon: Beverly Gust, MD;  Location: [facility];  Service: ENT;  Laterality: N/A;  . MAXILLARY ANTROSTOMY Bilateral 08/17/2015   Procedure: MAXILLARY ANTROSTOMY;  Surgeon: Beverly Gust, MD;  Location: [facility];  Service: ENT;  Laterality: Bilateral;  . NASAL TURBINATE REDUCTION Bilateral 08/17/2015   Procedure: TURBINATE REDUCTION/SUBMUCOSAL RESECTION;  Surgeon: Beverly Gust, MD;  Location: [facility];  Service: ENT;  Laterality: Bilateral;  . NO PAST SURGERIES    . SEPTOPLASTY N/A 08/17/2015   Procedure: SEPTOPLASTY;  Surgeon: Beverly Gust, MD;  Location: [facility];  Service: ENT;  Laterality: N/A;  DISK GAVE DISK TO TABITHA ON 4-06 KP  . SPHENOIDECTOMY Bilateral 08/17/2015   Procedure: SPHENOIDECTOMY;  Surgeon: Beverly Gust, MD;  Location: [facility];  Service: ENT;  Laterality: Bilateral;    Prior to Admission medications   Medication Sig Start Date End Date Taking? Authorizing Provider  oxyCODONE-acetaminophen (ROXICET) 5-325 MG tablet Take 1-2 tablets by mouth every 4 (four) hours as needed for severe pain. Patient not taking: Reported on 02/06/2017 08/17/15   Beverly Gust, MD  sulfamethoxazole-trimethoprim (BACTRIM DS,SEPTRA DS) 800-160  MG tablet Take 1 tablet by mouth 2 (two) times daily. Patient not taking: Reported on 02/06/2017 08/17/15   Beverly Gust, MD    History reviewed. No pertinent family history.   Social History  Substance Use Topics  . Smoking  status: Never Smoker  . Smokeless tobacco: Never Used  . Alcohol use No    Allergies as of 02/06/2017  . (No Known Allergies)    Review of Systems:    All systems reviewed and negative except where noted in HPI.   Physical Exam:  Vital signs in last 24 hours: Temp:  [98.7 F (37.1 C)-99.4 F (37.4 C)] 98.7 F (37.1 C) (10/20 0808) Pulse Rate:  [70-86] 74 (10/20 0808) Resp:  [16-22] 18 (10/19 1940) BP: (115-133)/(64-82) 125/72 (10/20 0808) SpO2:  [94 %-99 %] 97 % (10/20 0808) Weight:  [192 lb (87.1 kg)] 192 lb (87.1 kg) (10/19 1700) Last BM Date: 02/06/17 General:   Pleasant, cooperative in NAD Head:  Normocephalic and atraumatic. Eyes:   No icterus.   Conjunctiva pink. PERRLA. Ears:  Normal auditory acuity. Neck:  Supple; no masses or thyroidomegaly Lungs: Respirations even and unlabored. Lungs clear to auscultation bilaterally.   No wheezes, crackles, or rhonchi.  Heart:  Regular rate and rhythm;  Without murmur, clicks, rubs or gallops Abdomen:  Soft, nondistended, nontender. Normal bowel sounds. No appreciable masses or hepatomegaly.  No rebound or guarding.  Rectal:  Not performed. Msk:  Symmetrical without gross deformities.  Strength normal  Extremities:  Without edema, cyanosis or clubbing. Neurologic:  Alert and oriented x3;  grossly normal neurologically. Skin:  Intact without significant lesions or rashes. Cervical Nodes:  No significant cervical adenopathy. Psych:  Alert and cooperative. Normal affect.  LAB RESULTS: CBC Latest Ref Rng & Units 02/07/2017 02/06/2017  WBC 3.8 - 10.6 K/uL 15.7(H) 16.0(H)  Hemoglobin 13.0 - 18.0 g/dL 14.0 15.0  Hematocrit 40.0 - 52.0 % 40.9 45.4  Platelets 150 - 440 K/uL 266 323    BMET BMP Latest Ref Rng & Units 02/07/2017 02/06/2017  Glucose 65 - 99 mg/dL 86 103(H)  BUN 6 - 20 mg/dL 13 12  Creatinine 0.61 - 1.24 mg/dL 0.88 0.86  Sodium 135 - 145 mmol/L 138 136  Potassium 3.5 - 5.1 mmol/L 3.7 3.9  Chloride 101 - 111  mmol/L 105 100(L)  CO2 22 - 32 mmol/L 25 24  Calcium 8.9 - 10.3 mg/dL 8.6(L) 9.5    LFT Hepatic Function Latest Ref Rng & Units 02/06/2017  Total Protein 6.5 - 8.1 g/dL 7.9  Albumin 3.5 - 5.0 g/dL 4.6  AST 15 - 41 U/L 33  ALT 17 - 63 U/L 40  Alk Phosphatase 38 - 126 U/L 87  Total Bilirubin 0.3 - 1.2 mg/dL 0.9     STUDIES: Ct Renal Stone Study  Result Date: 02/06/2017 CLINICAL DATA:  LEFT abdominal and pelvic pain, rectal bleeding. EXAM: CT ABDOMEN AND PELVIS WITHOUT CONTRAST TECHNIQUE: Multidetector CT imaging of the abdomen and pelvis was performed following the standard protocol without IV contrast. COMPARISON:  None. FINDINGS: LOWER CHEST: Lung bases are clear. The visualized heart size is normal. No pericardial effusion. HEPATOBILIARY: Normal. PANCREAS: Normal. SPLEEN: Punctate splenic granuloma, spleen is otherwise unremarkable. ADRENALS/URINARY TRACT: Kidneys are orthotopic, demonstrating normal size and morphology. No nephrolithiasis, hydronephrosis; limited assessment for renal masses on this nonenhanced examination. The unopacified ureters are normal in course and caliber. Urinary bladder is partially distended and unremarkable. Normal adrenal glands. STOMACH/BOWEL: Rectosigmoid marked circumferential wall thickening and pericolonic  inflammation. The stomach, small bowel are normal in course and caliber without inflammatory changes, sensitivity decreased by lack of enteric contrast. A few suspected small bowel diverticulum. Normal appendix. VASCULAR/LYMPHATIC: Aortoiliac vessels are normal in course and caliber. No lymphadenopathy by CT size criteria. REPRODUCTIVE: Normal. OTHER: No intraperitoneal free fluid or free air. Presacral fat stranding. MUSCULOSKELETAL: Non-acute. Small fat containing umbilical hernia. Mild broad levoscoliosis. L5 limbus vertebra. Scattered old Schmorl's nodes. IMPRESSION: Severe rectosigmoid colitis seen with inflammatory bowel disease, less likely infectious  etiology. Electronically Signed   By: Elon Alas M.D.   On: 02/06/2017 14:49      Impression / Plan:   Ethan Jackson is a 41 y.o. male  Spanish-speaking, presents with one-day history of several episodes of hematochezia and lower abdominal pain, CT A/P with significant rectosigmoid inflammation. He also has urinary retention based on the bladder scan. He does not have AKI. Imaging did not reveal nephrolithiasis R pyelonephritis. He does not have urinary tract infection. Differentials include infectious colitis or inflammatory bowel disease or malignancy  900 cc of clear yellow urine was collected after in and out straight Catheterization  - Okay to start full liquid diet - Nothing by mouth past midnight - Check stool studies for C. difficile and GI pathogen panel - Check ESR and CRP, acute hepatitis panel, HIV - Okay to continue Cipro and Flagyl - Bowel prep today - Colonoscopy tomorrow  Thank you for involving me in the care of this patient.      LOS: 1 day   Sherri Sear, MD  02/07/2017, 11:50 AM   Note: This dictation was prepared with Dragon dictation along with smaller phrase technology. Any transcriptional errors that result from this process are unintentional.

## 2017-02-07 NOTE — Progress Notes (Signed)
Pt stated he had not urinated since yesterday at 5 pm. Bladder scan 860m

## 2017-02-07 NOTE — Progress Notes (Signed)
[facility] NAME: Ethan Jackson    MR#:  [id number]  DATE OF BIRTH:  1975-10-09  SUBJECTIVE:noted for severe abdominal pain, blood in the stool yesterday associated with elevated white count.Patient abdominal pain is better, no further fevers. WBC is down. Did not have any further rectal bleeding. No nausea. Patient slept well. Please change admitting diagnosis  to rectosigmoid colitis  not radiation colitis  CHIEF COMPLAINT:   Chief Complaint  Patient presents with  . GI Bleeding  . Abdominal Pain    REVIEW OF SYSTEMS:    Review of Systems  Constitutional: Negative for chills and fever.  HENT: Negative for hearing loss.   Eyes: Negative for blurred vision, double vision and photophobia.  Respiratory: Negative for cough, hemoptysis and shortness of breath.   Cardiovascular: Negative for palpitations, orthopnea and leg swelling.  Gastrointestinal: Positive for abdominal pain. Negative for diarrhea and vomiting.  Genitourinary: Negative for dysuria and urgency.  Musculoskeletal: Negative for myalgias and neck pain.  Skin: Negative for rash.  Neurological: Negative for dizziness, focal weakness, seizures, weakness and headaches.  Psychiatric/Behavioral: Negative for memory loss. The patient does not have insomnia.     Nutrition: npo Tolerating Diet: Tolerating PT:      DRUG ALLERGIES:  No Known Allergies  VITALS:  Blood pressure 117/64, pulse 70, temperature 99.4 F (37.4 C), temperature source Oral, resp. rate 18, height 5' 10"  (1.778 m), weight 87.1 kg (192 lb), SpO2 99 %.  PHYSICAL EXAMINATION:   Physical Exam  GENERAL:  41 y.o.-year-old patient lying in the bed with no acute distress.  EYES: Pupils equal, round, reactive to light . No scleral icterus. Extraocular muscles intact.  HEENT: Head atraumatic, normocephalic. Oropharynx and nasopharynx clear.  NECK:  Supple, no jugular venous distention. No  thyroid enlargement, no tenderness.  LUNGS: Normal breath sounds bilaterally, no wheezing, rales,rhonchi or crepitation. No use of accessory muscles of respiration.  CARDIOVASCULAR: S1, S2 normal. No murmurs, rubs, or gallops.  ABDOMEN: slight lower quadrant tenderness present ,no rebound tenderness patient has minimal pain today compared to yesterday. EXTREMITIES: No pedal edema, cyanosis, or clubbing.  NEUROLOGIC: Cranial nerves II through XII are intact. Muscle strength 5/5 in all extremities. Sensation intact. Gait not checked.  PSYCHIATRIC: The patient is alert and oriented x 3.  SKIN: No obvious rash, lesion, or ulcer.    LABORATORY PANEL:   CBC  Recent Labs Lab 02/07/17 0305  WBC 15.7*  HGB 14.0  HCT 40.9  PLT 266   ------------------------------------------------------------------------------------------------------------------  Chemistries   Recent Labs Lab 02/06/17 1146 02/07/17 0305  NA 136 138  K 3.9 3.7  CL 100* 105  CO2 24 25  GLUCOSE 103* 86  BUN 12 13  CREATININE 0.86 0.88  CALCIUM 9.5 8.6*  AST 33  --   ALT 40  --   ALKPHOS 87  --   BILITOT 0.9  --    ------------------------------------------------------------------------------------------------------------------  Cardiac Enzymes No results for input(s): TROPONINI in the last 168 hours. ------------------------------------------------------------------------------------------------------------------  RADIOLOGY:  Ct Renal Stone Study  Result Date: 02/06/2017 CLINICAL DATA:  LEFT abdominal and pelvic pain, rectal bleeding. EXAM: CT ABDOMEN AND PELVIS WITHOUT CONTRAST TECHNIQUE: Multidetector CT imaging of the abdomen and pelvis was performed following the standard protocol without IV contrast. COMPARISON:  None. FINDINGS: LOWER CHEST: Lung bases are clear. The visualized heart size is normal. No pericardial effusion. HEPATOBILIARY: Normal. PANCREAS: Normal. SPLEEN: Punctate splenic granuloma,  spleen is otherwise  unremarkable. ADRENALS/URINARY TRACT: Kidneys are orthotopic, demonstrating normal size and morphology. No nephrolithiasis, hydronephrosis; limited assessment for renal masses on this nonenhanced examination. The unopacified ureters are normal in course and caliber. Urinary bladder is partially distended and unremarkable. Normal adrenal glands. STOMACH/BOWEL: Rectosigmoid marked circumferential wall thickening and pericolonic inflammation. The stomach, small bowel are normal in course and caliber without inflammatory changes, sensitivity decreased by lack of enteric contrast. A few suspected small bowel diverticulum. Normal appendix. VASCULAR/LYMPHATIC: Aortoiliac vessels are normal in course and caliber. No lymphadenopathy by CT size criteria. REPRODUCTIVE: Normal. OTHER: No intraperitoneal free fluid or free air. Presacral fat stranding. MUSCULOSKELETAL: Non-acute. Small fat containing umbilical hernia. Mild broad levoscoliosis. L5 limbus vertebra. Scattered old Schmorl's nodes. 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 Electronically Signed   By: Elon Alas M.D.   On: 02/06/2017 14:49     ASSESSMENT AND PLAN:   Active Problems:   Acute radiation proctitis   1 .acute lower quadrant abdominal pain, hematochezia secondary to rectosigmoid colon colitis, Started on IV Cipro, Flagyl for colitis, GI is consulted for evaluation of IBD. GI has ordered C. Difficile colitis workup but patient never had BM after admission.iquids, continue IV antibiotics, GI consult is appreciated . Needs colonoscopy to evaluate for inflammatory bowel disease. Discussed with patient's wife.   All the records are reviewed and case discussed with Care Management/Social Workerr. Management plans discussed with the patient, family and they are in agreement.  CODE STATUS: full code  TOTAL TIME TAKING CARE OF THIS PATIENT: 35 minutes.    POSSIBLE D/C IN 1-2DAYS, DEPENDING ON CLINICAL CONDITION.   Epifanio Lesches M.D on 02/07/2017 at 7:46 AM  Between 7am to 6pm - Pager - [phone]  After 6pm go to [email or web address] - password [email or web address] [facility]  Office  [phone]  CC: Primary care physician; Patient, No Pcp Per

## 2017-02-08 ENCOUNTER — Encounter
Admission: EM | Disposition: A | Discharge status: Home / Self Care | Payer: Self-pay | Source: Home / Self Care | Attending: Internal Medicine

## 2017-02-08 ENCOUNTER — Inpatient Hospital Stay: Payer: Self-pay | Admitting: Anesthesiology

## 2017-02-08 DIAGNOSIS — K6289 Other specified diseases of anus and rectum: Secondary | ICD-10-CM

## 2017-02-08 DIAGNOSIS — K625 Hemorrhage of anus and rectum: Secondary | ICD-10-CM

## 2017-02-08 HISTORY — PX: COLONOSCOPY: SHX5424

## 2017-02-08 LAB — HEPATITIS PANEL, ACUTE
HCV Ab: <0.1 s/co ratio (ref 0.0–0.9)
HEP B C IGM: NEGATIVE
HEP B S AG: NEGATIVE
Hep A IgM: NEGATIVE

## 2017-02-08 LAB — C-REACTIVE PROTEIN: CRP: 9.4 mg/dL — ABNORMAL HIGH (ref <1.0)

## 2017-02-08 LAB — HIV ANTIBODY (ROUTINE TESTING W REFLEX): HIV SCREEN 4TH GENERATION: NONREACTIVE

## 2017-02-08 SURGERY — COLONOSCOPY
Anesthesia: General

## 2017-02-08 MED ORDER — PREDNISONE 20 MG PO TABS
40.0000 mg | ORAL_TABLET | Freq: Every day | ORAL | Status: DC
  Administered 2017-02-08 – 2017-02-10 (×3): 40 mg via ORAL
  Filled 2017-02-08 (×3): qty 2

## 2017-02-08 MED ORDER — SCOPOLAMINE 1 MG/3DAYS TD PT72
1.0000 | MEDICATED_PATCH | TRANSDERMAL | Status: DC
  Administered 2017-02-08: 1.5 mg via TRANSDERMAL
  Filled 2017-02-08: qty 1

## 2017-02-08 MED ORDER — PROPOFOL 10 MG/ML IV BOLUS
INTRAVENOUS | Status: DC | PRN
  Administered 2017-02-08: 30 mg via INTRAVENOUS
  Administered 2017-02-08: 70 mg via INTRAVENOUS

## 2017-02-08 MED ORDER — MIDAZOLAM HCL 5 MG/5ML IJ SOLN
INTRAMUSCULAR | Status: DC | PRN
  Administered 2017-02-08: 2 mg via INTRAVENOUS

## 2017-02-08 MED ORDER — LIDOCAINE HCL (PF) 2 % IJ SOLN
INTRAMUSCULAR | Status: AC
  Filled 2017-02-08: qty 10

## 2017-02-08 MED ORDER — ONDANSETRON HCL 4 MG/2ML IJ SOLN
INTRAMUSCULAR | Status: DC | PRN
  Administered 2017-02-08: 4 mg via INTRAVENOUS

## 2017-02-08 MED ORDER — ONDANSETRON HCL 4 MG/2ML IJ SOLN
INTRAMUSCULAR | Status: AC
  Filled 2017-02-08: qty 2

## 2017-02-08 MED ORDER — PROPOFOL 500 MG/50ML IV EMUL
INTRAVENOUS | Status: AC
  Filled 2017-02-08: qty 50

## 2017-02-08 MED ORDER — LIDOCAINE 2% (20 MG/ML) 5 ML SYRINGE
INTRAMUSCULAR | Status: DC | PRN
  Administered 2017-02-08: 25 mg via INTRAVENOUS

## 2017-02-08 MED ORDER — MESALAMINE 4 G RE ENEM
4.0000 g | ENEMA | Freq: Two times a day (BID) | RECTAL | Status: DC
  Administered 2017-02-08 – 2017-02-10 (×5): 4 g via RECTAL
  Filled 2017-02-08 (×7): qty 60

## 2017-02-08 MED ORDER — MIDAZOLAM HCL 2 MG/2ML IJ SOLN
INTRAMUSCULAR | Status: AC
  Filled 2017-02-08: qty 2

## 2017-02-08 MED ORDER — SCOPOLAMINE 1 MG/3DAYS TD PT72
MEDICATED_PATCH | TRANSDERMAL | Status: AC
  Filled 2017-02-08: qty 1

## 2017-02-08 MED ORDER — PROPOFOL 500 MG/50ML IV EMUL
INTRAVENOUS | Status: DC | PRN
  Administered 2017-02-08: 120 ug/kg/min via INTRAVENOUS

## 2017-02-08 MED ORDER — GLYCOPYRROLATE 0.2 MG/ML IJ SOLN
INTRAMUSCULAR | Status: AC
  Filled 2017-02-08: qty 1

## 2017-02-08 NOTE — Op Note (Signed)
[facility] Gastroenterology Patient Name: Ethan Jackson Procedure Date: 02/08/2017 11:27 AM MRN: [id number] Account #: [id number] Date of Birth: 05-31-1975 Admit Type: Inpatient Age: 41 Room: [facility] ENDO ROOM 3 Gender: Male Note Status: Finalized Procedure:            Colonoscopy Indications:          Lower abdominal pain, Rectal bleeding, Abnormal CT of                        the GI tract Providers:            Lin Landsman MD, MD Referring MD:         No Local Md, MD (Referring MD) Medicines:            Monitored Anesthesia Care Complications:        No immediate complications. Estimated blood loss:                        Minimal. Procedure:            Pre-Anesthesia Assessment:                       - Prior to the procedure, a History and Physical was                        performed, and patient medications and allergies were                        reviewed. The patient is competent. The risks and                        benefits of the procedure and the sedation options and                        risks were discussed with the patient. All questions                        were answered and informed consent was obtained.                        Patient identification and proposed procedure were                        verified by the physician, the nurse, the                        anesthesiologist, the anesthetist and the technician in                        the pre-procedure area in the procedure room. Mental                        Status Examination: alert and oriented. Airway                        Examination: normal oropharyngeal airway and neck                        mobility. Respiratory Examination: clear to  auscultation. CV Examination: normal. Prophylactic                        Antibiotics: The patient does not require prophylactic                        antibiotics. Prior Anticoagulants: The patient has                         taken no previous anticoagulant or antiplatelet agents.                        ASA Grade Assessment: II - A patient with mild systemic                        disease. After reviewing the risks and benefits, the                        patient was deemed in satisfactory condition to undergo                        the procedure. The anesthesia plan was to use monitored                        anesthesia care (MAC). Immediately prior to                        administration of medications, the patient was                        re-assessed for adequacy to receive sedatives. The                        heart rate, respiratory rate, oxygen saturations, blood                        pressure, adequacy of pulmonary ventilation, and                        response to care were monitored throughout the                        procedure. The physical status of the patient was                        re-assessed after the procedure.                       After obtaining informed consent, the colonoscope was                        passed under direct vision. Throughout the procedure,                        the patient's blood pressure, pulse, and oxygen                        saturations were monitored continuously. The                        Colonoscope was introduced  through the anus and                        advanced to the 12 cm into the ileum. The colonoscopy                        was performed without difficulty. The patient tolerated                        the procedure well. The quality of the bowel                        preparation was evaluated using the BBPS [facility] [employer] [facility] Bowel                        Preparation Scale) with scores of: Right Colon = 3,                        Transverse Colon = 3 and Left Colon = 3 (entire mucosa                        seen well with no residual staining, small fragments of                        stool or opaque liquid). The total BBPS score equals  9. Findings:      The perianal and digital rectal examinations were normal. Pertinent       negatives include normal sphincter tone and no palpable rectal lesions.      The terminal ileum appeared normal. Biopsies were taken with a cold       forceps for histology. Biopsies were taken with a cold forceps for       histology.      The sigmoid colon, descending colon, transverse colon, ascending colon       and cecum appeared normal. Biopsies were taken from right colon,       transverse and left colon in separate jars with a cold forceps for       histology.      A diffuse area of severely altered vascular, congested, friable (with       contact bleeding), inflamed and superficial ulcerated mucosa was found       from anus to rectum/rectosigmoid upto 30 cm proximal to the anus. Rectum       is diffusely involved, and more proximally, skipped areas of inflammed       mucosa with intervening normal mucosa seen upto rectosigmoid junction.       Features c/w inflammatory bowel disease. Biopsies were taken with a cold       forceps for histology. Impression:           - The examined portion of the ileum was normal.                        Biopsied.                       - The sigmoid colon, descending colon, transverse                        colon, ascending colon and cecum are normal. Biopsied.                       -  Altered vascular, congested, friable (with contact                        bleeding), inflamed and ulcerated mucosa from anus to                        rectosigmoid upto 30 cm proximal to the anus, features                        c/w IBD. Biopsied, also to r/o CMV, HSV. Recommendation:       - Await pathology results.                       - Return patient to hospital ward for ongoing care.                       - Advance diet as tolerated today.                       - Check Hep B S Ab total, Hep A Ab total, TPMT levels,                        gold quantiferon                        - Start mesalamine enema BID                       - Can stop cipro/flagyl                       - Start prednisone 19m daily for 2weeks, taper by 162m                       every week until finished                       - Close follow up with me in GI clinic in 2-3weeks                       - Anticipate d/c home in 1-2 days depending on his                        response to prednisone and mesalamine enemas Procedure Code(s):    --- Professional ---                       [id number], flexible; with biopsy, single or                        multiple Diagnosis Code(s):    --- Professional ---                       K63.89, Other specified diseases of intestine                       K92.2, Gastrointestinal hemorrhage, unspecified                       K52.9, Noninfective gastroenteritis and colitis,  unspecified                       K63.3, Ulcer of intestine                       R10.30, Lower abdominal pain, unspecified                       K62.5, Hemorrhage of anus and rectum                       R93.3, Abnormal findings on diagnostic imaging of other                        parts of digestive tract CPT copyright 2016 American Medical Association. All rights reserved. The codes documented in this report are preliminary and upon coder review may  be revised to meet current compliance requirements. Dr. Ulyess Mort Lin Landsman MD, MD 02/08/2017 1:12:55 PM This report has been signed electronically. Number of Addenda: 0 Note Initiated On: 02/08/2017 11:27 AM Scope Withdrawal Time: 0 hours 17 minutes 56 seconds  Total Procedure Duration: 0 hours 23 minutes 14 seconds       [facility]

## 2017-02-08 NOTE — Anesthesia Post-op Follow-up Note (Signed)
Anesthesia QCDR form completed.        

## 2017-02-08 NOTE — Transfer of Care (Signed)
Immediate Anesthesia Transfer of Care Note  Patient: Ethan Jackson  Procedure(s) Performed: COLONOSCOPY (N/A )  Patient Location: PACU  Anesthesia Type:General  Level of Consciousness: awake  Airway & Oxygen Therapy: Patient Spontanous Breathing and Patient connected to nasal cannula oxygen  Post-op Assessment: Report given to RN and Post -op Vital signs reviewed and stable  Post vital signs: Reviewed  Last Vitals:  Vitals:   02/08/17 1133 02/08/17 1246  BP: 131/82 (P) 104/63  Pulse: 76 79  Resp: 18 (P) 19  Temp: 36.9 C (P) 36.7 C  SpO2:  99%    Last Pain:  Vitals:   02/08/17 1133  TempSrc:   PainSc: 0-No pain      Patients Stated Pain Goal: 2 (12/39/35 9409)  Complications: No apparent anesthesia complications

## 2017-02-08 NOTE — Progress Notes (Signed)
Tap water enema given per order patient was able to tolerate 465m in 2 intervals

## 2017-02-08 NOTE — Anesthesia Postprocedure Evaluation (Signed)
Anesthesia Post Note  Patient: Ethan Jackson  Procedure(s) Performed: COLONOSCOPY (N/A )  Patient location during evaluation: Endoscopy Anesthesia Type: General Level of consciousness: awake and alert Pain management: pain level controlled Vital Signs Assessment: post-procedure vital signs reviewed and stable Respiratory status: spontaneous breathing, nonlabored ventilation, respiratory function stable and patient connected to nasal cannula oxygen Cardiovascular status: blood pressure returned to baseline and stable Postop Assessment: no apparent nausea or vomiting Anesthetic complications: no     Last Vitals:  Vitals:   02/08/17 1316 02/08/17 1329  BP: 99/64 120/69  Pulse: 69 66  Resp: (!) 26 20  Temp:  36.9 C  SpO2: 98% 97%    Last Pain:  Vitals:   02/08/17 1329  TempSrc: Oral  PainSc:                  Norm Wray S

## 2017-02-08 NOTE — Progress Notes (Signed)
Colonoscopy procedure note:  Findings: - The examined portion of the ileum was normal.  Biopsied.  - The sigmoid colon, descending colon, transverse colon, ascending colon and cecum are normal.  Biopsied.  - Altered vascular, congested, friable (with contact bleeding), inflamed and ulcerated mucosa from anus to rectosigmoid upto 30 cm proximal to the anus, features c/w IBD.  Biopsied, also to r/o CMV, HSV.    Recommendations: - Await pathology results.  - Return patient to hospital ward for ongoing care.  - Advance diet as tolerated today. - Check Hep B S Ab total, Hep A Ab total, TPMT levels, gold quantiferon  - Start mesalamine enema BID - Can stop cipro/flagyl - Start prednisone 50m daily for 2weeks, taper by 145mevery week until finished - Close follow up with me in GI clinic in 2-3weeks - Anticipate d/c home in 1-2 days depending on his response to prednisone and mesalamine enemas  RoCephas DarbyMD [address] [id number]: [phone]: [phone]: [phone]

## 2017-02-08 NOTE — Anesthesia Preprocedure Evaluation (Signed)
Anesthesia Evaluation  Patient identified by MRN, date of birth, ID band Patient awake    Reviewed: Allergy & Precautions, NPO status , Patient's Chart, lab work & pertinent test results, reviewed documented beta blocker date and time   Airway Mallampati: II  TM Distance: >3 FB     Dental  (+) Chipped   Pulmonary           Cardiovascular      Neuro/Psych    GI/Hepatic   Endo/Other    Renal/GU      Musculoskeletal   Abdominal   Peds  Hematology   Anesthesia Other Findings   Reproductive/Obstetrics                             Anesthesia Physical Anesthesia Plan  ASA: II  Anesthesia Plan: General   Post-op Pain Management:    Induction: Intravenous  PONV Risk Score and Plan:   Airway Management Planned:   Additional Equipment:   Intra-op Plan:   Post-operative Plan:   Informed Consent: I have reviewed the patients History and Physical, chart, labs and discussed the procedure including the risks, benefits and alternatives for the proposed anesthesia with the patient or authorized representative who has indicated his/her understanding and acceptance.     Plan Discussed with: CRNA  Anesthesia Plan Comments:         Anesthesia Quick Evaluation

## 2017-02-08 NOTE — Progress Notes (Signed)
[facility] at [place] NAME: Ethan Jackson    MR#:  [id number]  DATE OF BIRTH:  12/14/1975  SUBJECTIVE: less abdominal pain. Going for colonoscopy today.patient had urinary retention up to 1000  ml yesterday received in and out catheter. No further urine retention.  CHIEF COMPLAINT:   Chief Complaint  Patient presents with  . GI Bleeding  . Abdominal Pain    REVIEW OF SYSTEMS:    Review of Systems  Constitutional: Negative for chills and fever.  HENT: Negative for hearing loss.   Eyes: Negative for blurred vision, double vision and photophobia.  Respiratory: Negative for cough, hemoptysis and shortness of breath.   Cardiovascular: Negative for palpitations, orthopnea and leg swelling.  Gastrointestinal: Positive for abdominal pain. Negative for diarrhea and vomiting.  Genitourinary: Negative for dysuria and urgency.  Musculoskeletal: Negative for myalgias and neck pain.  Skin: Negative for rash.  Neurological: Negative for dizziness, focal weakness, seizures, weakness and headaches.  Psychiatric/Behavioral: Negative for memory loss. The patient does not have insomnia.     Nutrition: npo Tolerating Diet: Tolerating PT:      DRUG ALLERGIES:  No Known Allergies  VITALS:  Blood pressure 135/78, pulse 66, temperature 99.2 F (37.3 C), temperature source Oral, resp. rate 18, height 5' 10"  (1.778 m), weight 88.2 kg (194 lb 7.1 oz), SpO2 96 %.  PHYSICAL EXAMINATION:   Physical Exam  GENERAL:  41 y.o.-year-old patient lying in the bed with no acute distress.  EYES: Pupils equal, round, reactive to light . No scleral icterus. Extraocular muscles intact.  HEENT: Head atraumatic, normocephalic. Oropharynx and nasopharynx clear.  NECK:  Supple, no jugular venous distention. No thyroid enlargement, no tenderness.  LUNGS: Normal breath sounds bilaterally, no wheezing, rales,rhonchi or crepitation. No use of accessory muscles  of respiration.  CARDIOVASCULAR: S1, S2 normal. No murmurs, rubs, or gallops.  ABDOMEN: soft, non tender, non distended bowel sounds present  EXTREMITIES: No pedal edema, cyanosis, or clubbing.  NEUROLOGIC: Cranial nerves II through XII are intact. Muscle strength 5/5 in all extremities. Sensation intact. Gait not checked.  PSYCHIATRIC: The patient is alert and oriented x 3.  SKIN: No obvious rash, lesion, or ulcer.    LABORATORY PANEL:   CBC  Recent Labs Lab 02/07/17 0305  WBC 15.7*  HGB 14.0  HCT 40.9  PLT 266   ------------------------------------------------------------------------------------------------------------------  Chemistries   Recent Labs Lab 02/06/17 1146 02/07/17 0305  NA 136 138  K 3.9 3.7  CL 100* 105  CO2 24 25  GLUCOSE 103* 86  BUN 12 13  CREATININE 0.86 0.88  CALCIUM 9.5 8.6*  AST 33  --   ALT 40  --   ALKPHOS 87  --   BILITOT 0.9  --    ------------------------------------------------------------------------------------------------------------------  Cardiac Enzymes No results for input(s): TROPONINI in the last 168 hours. ------------------------------------------------------------------------------------------------------------------  RADIOLOGY:  Ct Renal Stone Study  Result Date: 02/06/2017 CLINICAL DATA:  LEFT abdominal and pelvic pain, rectal bleeding. EXAM: CT ABDOMEN AND PELVIS WITHOUT CONTRAST TECHNIQUE: Multidetector CT imaging of the abdomen and pelvis was performed following the standard protocol without IV contrast. COMPARISON:  None. FINDINGS: LOWER CHEST: Lung bases are clear. The visualized heart size is normal. No pericardial effusion. HEPATOBILIARY: Normal. PANCREAS: Normal. SPLEEN: Punctate splenic granuloma, spleen is otherwise unremarkable. ADRENALS/URINARY TRACT: Kidneys are orthotopic, demonstrating normal size and morphology. No nephrolithiasis, hydronephrosis; limited assessment for renal masses on this nonenhanced  examination. The unopacified ureters are normal  in course and caliber. Urinary bladder is partially distended and unremarkable. Normal adrenal glands. STOMACH/BOWEL: Rectosigmoid marked circumferential wall thickening and pericolonic inflammation. The stomach, small bowel are normal in course and caliber without inflammatory changes, sensitivity decreased by lack of enteric contrast. A few suspected small bowel diverticulum. Normal appendix. VASCULAR/LYMPHATIC: Aortoiliac vessels are normal in course and caliber. No lymphadenopathy by CT size criteria. REPRODUCTIVE: Normal. OTHER: No intraperitoneal free fluid or free air. Presacral fat stranding. MUSCULOSKELETAL: Non-acute. Small fat containing umbilical hernia. Mild broad levoscoliosis. L5 limbus vertebra. Scattered old Schmorl's nodes. 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 Electronically Signed   By: Elon Alas M.D.   On: 02/06/2017 14:49     ASSESSMENT AND PLAN:   Active Problems:   Acute radiation proctitis   Colitis   1 .acute lower quadrant abdominal pain, hematochezia secondary to rectosigmoid colon colitis, Started on IV Cipro, Flagyl for colitis, going for colonoscopy today to evaluate for inflammatory bowel disease, continue IV antibiotics, IV fluids, IV PPIs. Discussed with patient's wife. #2 urinary retention yesterday likely secondary to all th bowel edema by edema pressing on the bladder;resolved.  Discussed with patient's wife.   All the records are reviewed and case discussed with Care Management/Social Workerr. Management plans discussed with the patient, family and they are in agreement.  CODE STATUS: full code  TOTAL TIME TAKING CARE OF THIS PATIENT: 35 minutes.   POSSIBLE D/C IN 1-2DAYS, DEPENDING ON CLINICAL CONDITION.   Epifanio Lesches M.D on 02/08/2017 at 10:13 AM  Between 7am to 6pm - Pager - [phone]  After 6pm go to  [email or web address] - password [email or web address] [facility]  Office  [phone]  CC: Primary care physician; Patient, No Pcp Per

## 2017-02-09 LAB — CBC
HCT: 39.2 % — ABNORMAL LOW (ref 40.0–52.0)
Hemoglobin: 13.1 g/dL (ref 13.0–18.0)
MCH: 28.3 pg (ref 26.0–34.0)
MCHC: 33.4 g/dL (ref 32.0–36.0)
MCV: 84.7 fL (ref 80.0–100.0)
PLATELETS: 264 10*3/uL (ref 150–440)
RBC: 4.63 MIL/uL (ref 4.40–5.90)
RDW: 13.3 % (ref 11.5–14.5)
WBC: 11 10*3/uL — AB (ref 3.8–10.6)

## 2017-02-09 LAB — GLUCOSE, CAPILLARY: GLUCOSE-CAPILLARY: 85 mg/dL (ref 65–99)

## 2017-02-09 NOTE — Care Management Note (Signed)
Case Management Note  Patient Details  Name: Ethan Jackson MRN: [id number] Date of Birth: 11/07/1975  Subjective/Objective:  Met with patients wife at bedside as patient slept. Provided application for medication management and [facility]. Provided both english and spanish copies. Wife speaks good english and was able to recall all that I presented. It is anticipated patient will discharge home tomorrow. Email sent to both agencies                  Action/Plan:   Expected Discharge Date:  02/08/17               Expected Discharge Plan:  Home/Self Care  In-House Referral:     Discharge planning Services  CM Consult, Homebound not met per provider, [facility]igent [facility], Medication Assistance  Post Acute Care Choice:    Choice offered to:     DME Arranged:    DME Agency:     HH Arranged:    HH Agency:     Status of Service:  Completed, signed off  If discussed at Long Length of Stay Meetings, dates discussed:    Additional Comments:   M , RN 02/09/2017, 3:40 PM  

## 2017-02-09 NOTE — Progress Notes (Addendum)
[facility] at [place] NAME: Ethan Jackson    MR#:  [id number]  DATE OF BIRTH:  [birth year]    CHIEF COMPLAINT:  Patient is resting comfortably. Abdominal pain is manageable. Improving diarrhea with blood Had a colonoscopy yesterday Chief Complaint  Patient presents with  . GI Bleeding  . Abdominal Pain    REVIEW OF SYSTEMS:    Review of Systems  Constitutional: Negative for chills and fever.  HENT: Negative for hearing loss.   Eyes: Negative for blurred vision, double vision and photophobia.  Respiratory: Negative for cough, hemoptysis and shortness of breath.   Cardiovascular: Negative for palpitations, orthopnea and leg swelling.  Gastrointestinal: Negative for abdominal pain, diarrhea and vomiting.  Genitourinary: Negative for dysuria and urgency.  Musculoskeletal: Negative for myalgias and neck pain.  Skin: Negative for rash.  Neurological: Negative for dizziness, focal weakness, seizures, weakness and headaches.  Psychiatric/Behavioral: Negative for memory loss. The patient does not have insomnia.     Nutrition: npo Tolerating Diet: Tolerating PT:      DRUG ALLERGIES:  No Known Allergies  VITALS:  Blood pressure 95/76, pulse 66, temperature 98.4 F (36.9 C), temperature source Oral, resp. rate 20, height 5' 10"  (1.778 m), weight 88.2 kg (194 lb 7.1 oz), SpO2 98 %.  PHYSICAL EXAMINATION:   Physical Exam  GENERAL:  41 y.o.-year-old patient lying in the bed with no acute distress.  EYES: Pupils equal, round, reactive to light . No scleral icterus. Extraocular muscles intact.  HEENT: Head atraumatic, normocephalic. Oropharynx and nasopharynx clear.  NECK:  Supple, no jugular venous distention. No thyroid enlargement, no tenderness.  LUNGS: Normal breath sounds bilaterally, no wheezing, rales,rhonchi or crepitation. No use of accessory muscles of respiration.  CARDIOVASCULAR: S1, S2 normal. No murmurs, rubs,  or gallops.  ABDOMEN: soft, non tender, non distended bowel sounds present  EXTREMITIES: No pedal edema, cyanosis, or clubbing.  NEUROLOGIC: Cranial nerves II through XII are intact. Muscle strength 5/5 in all extremities. Sensation intact. Gait not checked.  PSYCHIATRIC: The patient is alert and oriented x 3.  SKIN: No obvious rash, lesion, or ulcer.    LABORATORY PANEL:   CBC  Recent Labs Lab 02/09/17 0911  WBC 11.0*  HGB 13.1  HCT 39.2*  PLT 264   ------------------------------------------------------------------------------------------------------------------  Chemistries   Recent Labs Lab 02/06/17 1146 02/07/17 0305  NA 136 138  K 3.9 3.7  CL 100* 105  CO2 24 25  GLUCOSE 103* 86  BUN 12 13  CREATININE 0.86 0.88  CALCIUM 9.5 8.6*  AST 33  --   ALT 40  --   ALKPHOS 87  --   BILITOT 0.9  --    ------------------------------------------------------------------------------------------------------------------  Cardiac Enzymes No results for input(s): TROPONINI in the last 168 hours. ------------------------------------------------------------------------------------------------------------------  RADIOLOGY:  No results found.   ASSESSMENT AND PLAN:   Active Problems:   Colitis   1 .acute lower quadrant abdominal pain, hematochezia secondary to rectosigmoid colon colitis Clinically improving Patient had colonoscopy on 02/08/2017 revealed altered vascular, congested, friable (with contact bleeding), inflamed and ulcerated mucosa from anus to rectosigmoid upto 30 cm proximal to the anus, features c/w IBD.  Biopsied, also to r/o CMV, HSV.  Recommended to discontinue IV antibiotics Continue mesalamine enema twice a day per GI recommendations Patient is started on prednisone 40 mg by mouth daily for 2 weeks, followed by tapering by 10 MG every week until finished Outpatient follow-up with gastroenterology Dr. Mardi Mainland in  2-3 weeks after discharge  Anticipate  discharged in a.m.  TB QuantiFERON test is pending Hepatitis panel negative    #2 urinary retention yesterday likely secondary to  bowel edema by edema pressing on the bladder;resolved.  Discussed with pt and patient's wife.   All the records are reviewed and case discussed with Care Management/Social Workerr. Management plans discussed with the patient, family and they are in agreement.  CODE STATUS: full code  TOTAL TIME TAKING CARE OF THIS PATIENT: 90mnutes.   POSSIBLE D/C IN 1DAYS, DEPENDING ON CLINICAL CONDITION.   GNicholes MangoM.D on 02/09/2017 at 3:16 PM  Between 7am to 6pm - Pager - [phone] After 6pm go to [email or web address] - password [email or web address]  Office  [phone] CC: Primary care physician; Patient, No Pcp Per

## 2017-02-09 NOTE — Progress Notes (Signed)
Pt alert and oriented. No complaints of pain or nausea this shift. Pt was able to sleep in between care. Tolerated meslamine enema without difficulty.

## 2017-02-09 NOTE — Progress Notes (Signed)
Ethan Darby, MD [address]  [place]  [place],  [address]  Main: [phone]  Fax: [phone] Pager: [phone] Ethan Jackson is being followed for colitis, Day 2 of follow up    Subjective: Reports feeling significantly better Able to tolerate food Had a small soft bowel movement mixed with blood Able to urinate on his own He said he is able to tolerate mesalamine enemas and comfortable holding them Stool studies are negative for C. difficile and other enteric pathogens   Objective: Vital signs in last 24 hours: Vitals:   02/08/17 1312 02/08/17 1316 02/08/17 1329 02/08/17 1900  BP:  99/64 120/69 95/76  Pulse: 64 69 66   Resp: 14 (!) 26 20   Temp: 98.3 F (36.8 C)  98.4 F (36.9 C)   TempSrc:   Oral   SpO2: 100% 98% 97% 98%  Weight:      Height:       Weight change:  No intake or output data in the 24 hours ending 02/09/17 1515   Exam: Heart:: Regular rate and rhythm, S1S2 present or without murmur or extra heart sounds Lungs: clear to auscultation Abdomen: soft, nontender, normal bowel sounds   Lab Results: CBC Latest Ref Rng & Units 02/09/2017 02/07/2017 02/06/2017  WBC 3.8 - 10.6 K/uL 11.0(H) 15.7(H) 16.0(H)  Hemoglobin 13.0 - 18.0 g/dL 13.1 14.0 15.0  Hematocrit 40.0 - 52.0 % 39.2(L) 40.9 45.4  Platelets 150 - 440 K/uL 264 266 323   Micro Results: Recent Results (from the past 240 hour(s))  Gastrointestinal Panel by PCR , Stool     Status: None   Collection Time: 02/07/17  2:04 PM  Result Value Ref Range Status   Campylobacter species NOT DETECTED NOT DETECTED Final   Plesimonas shigelloides NOT DETECTED NOT DETECTED Final   Salmonella species NOT DETECTED NOT DETECTED Final   Yersinia enterocolitica NOT DETECTED NOT DETECTED Final   Vibrio species NOT DETECTED NOT DETECTED Final   Vibrio cholerae NOT DETECTED NOT DETECTED Final   Enteroaggregative E coli (EAEC) NOT DETECTED NOT DETECTED Final   Enteropathogenic  E coli (EPEC) NOT DETECTED NOT DETECTED Final   Enterotoxigenic E coli (ETEC) NOT DETECTED NOT DETECTED Final   Shiga like toxin producing E coli (STEC) NOT DETECTED NOT DETECTED Final   Shigella/Enteroinvasive E coli (EIEC) NOT DETECTED NOT DETECTED Final   Cryptosporidium NOT DETECTED NOT DETECTED Final   Cyclospora cayetanensis NOT DETECTED NOT DETECTED Final   Entamoeba histolytica NOT DETECTED NOT DETECTED Final   Giardia lamblia NOT DETECTED NOT DETECTED Final   Adenovirus F40/41 NOT DETECTED NOT DETECTED Final   Astrovirus NOT DETECTED NOT DETECTED Final   Norovirus GI/GII NOT DETECTED NOT DETECTED Final   Rotavirus A NOT DETECTED NOT DETECTED Final   Sapovirus (I, II, IV, and V) NOT DETECTED NOT DETECTED Final  C difficile quick scan w PCR reflex     Status: None   Collection Time: 02/07/17  2:04 PM  Result Value Ref Range Status   C Diff antigen NEGATIVE NEGATIVE Final   C Diff toxin NEGATIVE NEGATIVE Final   C Diff interpretation No C. difficile detected.  Final   Studies/Results: No results found. Medications: I have reviewed the patient's current medications. Scheduled Meds: . mesalamine  4 g Rectal BID  . predniSONE  40 mg Oral Q breakfast   Continuous Infusions: PRN Meds:.acetaminophen **OR** acetaminophen, HYDROmorphone (DILAUDID) injection, ondansetron **OR** ondansetron (ZOFRAN) IV   Assessment: Active Problems:  Acute radiation proctitis   Colitis  Colonoscopy 02/08/2017: - Altered vascular, congested, friable (with contact bleeding), inflamed and ulcerated mucosa from anus to rectosigmoid upto 30 cm proximal to the anus, features c/w IBD.   Improving clinically  Plan: - Await pathology results.  - Regular diet - Hep B S Ab total, Hep A Ab total, TPMT levels, gold quantiferon (for latent TB) in process - Continue mesalamine enema BID and as outpatient - Continue prednisone 35m daily for 2weeks, taper by 18mevery week until finished - Close follow up  with me in GI clinic in 2-3weeks after discharge - Okay to discharge home today from GI standpoint    LOS: 3 days   Ethan Jackson 02/09/2017, 3:15 PM

## 2017-02-10 ENCOUNTER — Encounter: Payer: Self-pay | Admitting: Gastroenterology

## 2017-02-10 LAB — HEPATITIS A ANTIBODY, TOTAL: HEP A TOTAL AB: POSITIVE — AB

## 2017-02-10 LAB — HEPATITIS B SURFACE ANTIBODY, QUANTITATIVE

## 2017-02-10 LAB — GLUCOSE, CAPILLARY: Glucose-Capillary: 93 mg/dL (ref 65–99)

## 2017-02-10 MED ORDER — ACETAMINOPHEN 325 MG PO TABS: 650.0000 mg | ORAL_TABLET | Freq: Four times a day (QID) | ORAL | Status: DC | PRN

## 2017-02-10 MED ORDER — PREDNISONE 10 MG PO TABS: 40.0000 mg | ORAL_TABLET | Freq: Every day | ORAL | 0 refills | Status: DC

## 2017-02-10 MED ORDER — MESALAMINE 4 G RE ENEM: 4.0000 g | ENEMA | Freq: Two times a day (BID) | RECTAL | 1 refills | Status: DC

## 2017-02-10 NOTE — Discharge Summary (Addendum)
[facility] NAME: Ethan Jackson    MR#:  [id number]  DATE OF BIRTH:  1975/05/23  DATE OF ADMISSION:  02/06/2017 ADMITTING PHYSICIAN: Epifanio Lesches, MD  DATE OF DISCHARGE: 02/10/17  PRIMARY CARE PHYSICIAN: Patient, No Pcp Per    ADMISSION DIAGNOSIS:  rectal bleeding hematochezia, abnormal CT  DISCHARGE DIAGNOSIS:  Active Problems:   Colitis   SECONDARY DIAGNOSIS:   Past Medical History:  Diagnosis Date  . Motion sickness    amusement park rides    HOSPITAL COURSE:   HPI  Ethan Jackson  is a 41 y.o. male with past medical history brought in because of severe abdominal pain across the lower abdomen and right lower nausea,blood in stool roll times today, CAT scan of abdomen done in the emergency room showed severe rectosigmoid colitis, elevated WBC up to 19. Patient also found to have low-grade temperature 99.6.  He  denies any history of for inflammatory bowel disease or diverticulitis.  1 .acute lower quadrant abdominal pain, hematochezia secondary to rectosigmoid colon colitis Clinically improving,denies any abdominal pain today except during defecating Patient had colonoscopy on 02/08/2017 revealed altered vascular, congested, friable (with contact bleeding), inflamed and ulcerated mucosa from anus to rectosigmoid upto 30 cm proximal to the anus, features c/w IBD. Biopsied, also to r/o CMV, HSV.  Recommended to discontinue IV antibiotics Continue mesalamine enema twice a day per GI recommendations Patient is started on prednisone 40 mg by mouth daily for 2 weeks, followed by tapering by 10 MG every week until finished Outpatient follow-up with gastroenterology Dr. Mardi Mainland in 2-3 weeks after discharge  Anticipate discharged in a.m.  TB QuantiFERON test is pending- pcp to follow up Hepatitis panel negative except hepatitis A antibody positive    #2 urinary retention yesterday likely secondary  to  bowel edema by edema pressing on the bladder;resolved.  Discussed with pt and patient's wife.    DISCHARGE CONDITIONS:   stable  CONSULTS OBTAINED:  Treatment Team:  Lin Landsman, MD   PROCEDURES  NONE   DRUG ALLERGIES:  No Known Allergies  DISCHARGE MEDICATIONS:   Current Discharge Medication List    START taking these medications   Details  acetaminophen (TYLENOL) 325 MG tablet Take 2 tablets (650 mg total) by mouth every 6 (six) hours as needed for mild pain (or Fever >/= 101).    mesalamine (ROWASA) 4 g enema Place 60 mLs (4 g total) rectally 2 (two) times daily. Qty: 30 Bottle, Refills: 1    predniSONE (DELTASONE) 10 MG tablet Take 4 tablets (40 mg total) by mouth daily. Prednisone 40 mg by mouth once daily for 12 days Followed by 30 mg for 1 week Followed by 20 mg for 1 week Followed by 10 mg for 1 week and stop Qty: 100 tablet, Refills: 0      STOP taking these medications     oxyCODONE-acetaminophen (ROXICET) 5-325 MG tablet      sulfamethoxazole-trimethoprim (BACTRIM DS,SEPTRA DS) 800-160 MG tablet          DISCHARGE INSTRUCTIONS:   Follow-up with primary care physician in 3 days, PCP to follow up on pending Quanteferon test Follow-up with gastroenterology in 10-14 days  DIET:  Regular diet  DISCHARGE CONDITION:  Stable  ACTIVITY:  Activity as tolerated  OXYGEN:  Home Oxygen: No.   Oxygen Delivery: room air  DISCHARGE LOCATION:  home   If you experience worsening of your admission symptoms, develop shortness  of breath, life threatening emergency, suicidal or homicidal thoughts you must seek medical attention immediately by calling 911 or calling your MD immediately  if symptoms less severe.  You Must read complete instructions/literature along with all the possible adverse reactions/side effects for all the Medicines you take and that have been prescribed to you. Take any new Medicines after you have completely understood  and accpet all the possible adverse reactions/side effects.   Please note  You were cared for by a hospitalist during your hospital stay. If you have any questions about your discharge medications or the care you received while you were in the hospital after you are discharged, you can call the unit and asked to speak with the hospitalist on call if the hospitalist that took care of you is not available. Once you are discharged, your primary care physician will handle any further medical issues. Please note that NO REFILLS for any discharge medications will be authorized once you are discharged, as it is imperative that you return to your primary care physician (or establish a relationship with a primary care physician if you do not have one) for your aftercare needs so that they can reassess your need for medications and monitor your lab values.     Today  Chief Complaint  Patient presents with  . GI Bleeding  . Abdominal Pain   Sent is feeling fine and denies any abdominal pain. Denies any nausea tolerating diet once to go home  ROS:  CONSTITUTIONAL: Denies fevers, chills. Denies any fatigue, weakness.  EYES: Denies blurry vision, double vision, eye pain. EARS, NOSE, THROAT: Denies tinnitus, ear pain, hearing loss. RESPIRATORY: Denies cough, wheeze, shortness of breath.  CARDIOVASCULAR: Denies chest pain, palpitations, edema.  GASTROINTESTINAL: Denies nausea, vomiting, diarrhea, abdominal pain. Denies bright red blood per rectum. GENITOURINARY: Denies dysuria, hematuria. ENDOCRINE: Denies nocturia or thyroid problems. HEMATOLOGIC AND LYMPHATIC: Denies easy bruising or bleeding. SKIN: Denies rash or lesion. MUSCULOSKELETAL: Denies pain in neck, back, shoulder, knees, hips or arthritic symptoms.  NEUROLOGIC: Denies paralysis, paresthesias.  PSYCHIATRIC: Denies anxiety or depressive symptoms.   VITAL SIGNS:  Blood pressure 112/68, pulse (!) 58, temperature 98.8 F (37.1 C),  temperature source Oral, resp. rate 18, height 5' 10"  (1.778 m), weight 88.2 kg (194 lb 7.1 oz), SpO2 99 %.  I/O:    Intake/Output Summary (Last 24 hours) at 02/10/17 1322 Last data filed at 02/10/17 0900  Gross per 24 hour  Intake              360 ml  Output                0 ml  Net              360 ml    PHYSICAL EXAMINATION:  GENERAL:  41 y.o.-year-old patient lying in the bed with no acute distress.  EYES: Pupils equal, round, reactive to light and accommodation. No scleral icterus. Extraocular muscles intact.  HEENT: Head atraumatic, normocephalic. Oropharynx and nasopharynx clear.  NECK:  Supple, no jugular venous distention. No thyroid enlargement, no tenderness.  LUNGS: Normal breath sounds bilaterally, no wheezing, rales,rhonchi or crepitation. No use of accessory muscles of respiration.  CARDIOVASCULAR: S1, S2 normal. No murmurs, rubs, or gallops.  ABDOMEN: Soft, non-tender, non-distended. Bowel sounds present. No organomegaly or mass.  EXTREMITIES: No pedal edema, cyanosis, or clubbing.  NEUROLOGIC: Cranial nerves II through XII are intact. Muscle strength 5/5 in all extremities. Sensation intact. Gait not checked.  PSYCHIATRIC: The  patient is alert and oriented x 3.  SKIN: No obvious rash, lesion, or ulcer.   DATA REVIEW:   CBC  Recent Labs Lab 02/09/17 0911  WBC 11.0*  HGB 13.1  HCT 39.2*  PLT 264    Chemistries   Recent Labs Lab 02/06/17 1146 02/07/17 0305  NA 136 138  K 3.9 3.7  CL 100* 105  CO2 24 25  GLUCOSE 103* 86  BUN 12 13  CREATININE 0.86 0.88  CALCIUM 9.5 8.6*  AST 33  --   ALT 40  --   ALKPHOS 87  --   BILITOT 0.9  --     Cardiac Enzymes No results for input(s): TROPONINI in the last 168 hours.  Microbiology Results  Results for orders placed or performed during the hospital encounter of 02/06/17  Gastrointestinal Panel by PCR , Stool     Status: None   Collection Time: 02/07/17  2:04 PM  Result Value Ref Range Status    Campylobacter species NOT DETECTED NOT DETECTED Final   Plesimonas shigelloides NOT DETECTED NOT DETECTED Final   Salmonella species NOT DETECTED NOT DETECTED Final   Yersinia enterocolitica NOT DETECTED NOT DETECTED Final   Vibrio species NOT DETECTED NOT DETECTED Final   Vibrio cholerae NOT DETECTED NOT DETECTED Final   Enteroaggregative E coli (EAEC) NOT DETECTED NOT DETECTED Final   Enteropathogenic E coli (EPEC) NOT DETECTED NOT DETECTED Final   Enterotoxigenic E coli (ETEC) NOT DETECTED NOT DETECTED Final   Shiga like toxin producing E coli (STEC) NOT DETECTED NOT DETECTED Final   Shigella/Enteroinvasive E coli (EIEC) NOT DETECTED NOT DETECTED Final   Cryptosporidium NOT DETECTED NOT DETECTED Final   Cyclospora cayetanensis NOT DETECTED NOT DETECTED Final   Entamoeba histolytica NOT DETECTED NOT DETECTED Final   Giardia lamblia NOT DETECTED NOT DETECTED Final   Adenovirus F40/41 NOT DETECTED NOT DETECTED Final   Astrovirus NOT DETECTED NOT DETECTED Final   Norovirus GI/GII NOT DETECTED NOT DETECTED Final   Rotavirus A NOT DETECTED NOT DETECTED Final   Sapovirus (I, II, IV, and V) NOT DETECTED NOT DETECTED Final  C difficile quick scan w PCR reflex     Status: None   Collection Time: 02/07/17  2:04 PM  Result Value Ref Range Status   C Diff antigen NEGATIVE NEGATIVE Final   C Diff toxin NEGATIVE NEGATIVE Final   C Diff interpretation No C. difficile detected.  Final    RADIOLOGY:  Ct Renal Stone Study  Result Date: 02/06/2017 CLINICAL DATA:  LEFT abdominal and pelvic pain, rectal bleeding. EXAM: CT ABDOMEN AND PELVIS WITHOUT CONTRAST TECHNIQUE: Multidetector CT imaging of the abdomen and pelvis was performed following the standard protocol without IV contrast. COMPARISON:  None. FINDINGS: LOWER CHEST: Lung bases are clear. The visualized heart size is normal. No pericardial effusion. HEPATOBILIARY: Normal. PANCREAS: Normal. SPLEEN: Punctate splenic granuloma, spleen is otherwise  unremarkable. ADRENALS/URINARY TRACT: Kidneys are orthotopic, demonstrating normal size and morphology. No nephrolithiasis, hydronephrosis; limited assessment for renal masses on this nonenhanced examination. The unopacified ureters are normal in course and caliber. Urinary bladder is partially distended and unremarkable. Normal adrenal glands. STOMACH/BOWEL: Rectosigmoid marked circumferential wall thickening and pericolonic inflammation. The stomach, small bowel are normal in course and caliber without inflammatory changes, sensitivity decreased by lack of enteric contrast. A few suspected small bowel diverticulum. Normal appendix. VASCULAR/LYMPHATIC: Aortoiliac vessels are normal in course and caliber. No lymphadenopathy by CT size criteria. REPRODUCTIVE: Normal. OTHER: No intraperitoneal free fluid or free  air. Presacral fat stranding. MUSCULOSKELETAL: Non-acute. Small fat containing umbilical hernia. Mild broad levoscoliosis. L5 limbus vertebra. Scattered old Schmorl's nodes. 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 Electronically Signed   By: Elon Alas M.D.   On: 02/06/2017 14:49    EKG:  No orders found for this or any previous visit.    Management plans discussed with the patient, wife and they are in agreement.patient's phone number is [phone]  CODE STATUS:     Code Status Orders        Start     Ordered   02/06/17 1516  Full code  Continuous     02/06/17 1517    Code Status History    Date Active Date Inactive Code Status Order ID Comments User Context   This patient has a current code status but no historical code status.      TOTAL TIME TAKING CARE OF THIS PATIENT: 43 minutes.   Note: This dictation was prepared with Dragon dictation along with smaller phrase technology. Any transcriptional errors that result from this process are unintentional.   @M[facility] @  on 02/10/2017 at 1:22 PM  Between  7am to 6pm - Pager - [phone]  After 6pm go to [email or web address] - password [email or web address] [facility]  Office  [phone]  CC: Primary care physician; Patient, No Pcp Per

## 2017-02-10 NOTE — Discharge Instructions (Signed)
Follow-up with primary care physician in 3 days, PCP to follow up on pending Quanteferon test Follow-up with gastroenterology in 10-14 days

## 2017-02-10 NOTE — Progress Notes (Addendum)
[place], [place].   02/10/2017  Patient: Ethan Jackson   Date of Birth:  Sep 06, 1975  Date of admission:  02/06/2017  Date of Discharge  02/10/2017    To Whom it May Concern:   Evander Cage Gupton  Is in the hospital, his wife Romilda Garret accompanied him during the hospital course , may return to work on 02/11/17.  PHYSICAL ACTIVITY:  Full  If you have any questions or concerns, please don't hesitate to call.  Sincerely,   Nicholes Mango M.D Pager [phone] Office : [phone]   .

## 2017-02-12 ENCOUNTER — Encounter: Payer: Self-pay | Admitting: Gastroenterology

## 2017-02-13 LAB — MISC LABCORP TEST (SEND OUT): LABCORP TEST CODE: 510750

## 2017-02-17 LAB — QUANTIFERON-TB GOLD PLUS (RQFGPL)
QUANTIFERON MITOGEN VALUE: 0.1 [IU]/mL
QuantiFERON Nil Value: 0.07 IU/mL
QuantiFERON TB1 Ag Value: 0.07 IU/mL
QuantiFERON TB2 Ag Value: 0.03 IU/mL

## 2017-02-17 LAB — QUANTIFERON-TB GOLD PLUS: QUANTIFERON-TB GOLD PLUS: UNDETERMINED

## 2017-02-19 ENCOUNTER — Other Ambulatory Visit: Payer: Self-pay

## 2017-02-20 ENCOUNTER — Telehealth: Payer: Self-pay | Admitting: Gastroenterology

## 2017-02-20 ENCOUNTER — Encounter: Payer: Self-pay | Admitting: Gastroenterology

## 2017-02-20 ENCOUNTER — Ambulatory Visit (INDEPENDENT_AMBULATORY_CARE_PROVIDER_SITE_OTHER): Payer: Self-pay | Admitting: Gastroenterology

## 2017-02-20 VITALS — BP 149/83 | HR 76 | Ht 69.0 in | Wt 193.8 lb

## 2017-02-20 DIAGNOSIS — K6289 Other specified diseases of anus and rectum: Secondary | ICD-10-CM

## 2017-02-20 DIAGNOSIS — K55039 Acute (reversible) ischemia of large intestine, extent unspecified: Secondary | ICD-10-CM

## 2017-02-20 DIAGNOSIS — K51211 Ulcerative (chronic) proctitis with rectal bleeding: Secondary | ICD-10-CM

## 2017-02-20 LAB — SURGICAL PATHOLOGY

## 2017-02-20 MED ORDER — METRONIDAZOLE 500 MG PO TABS: 500.0000 mg | ORAL_TABLET | Freq: Two times a day (BID) | ORAL | 0 refills | Status: AC

## 2017-02-20 MED ORDER — CIPROFLOXACIN HCL 500 MG PO TABS: 500.0000 mg | ORAL_TABLET | Freq: Two times a day (BID) | ORAL | 0 refills | Status: DC

## 2017-02-20 NOTE — Telephone Encounter (Signed)
I reviewed rectosigmoid biopsies with the pathologist and he has features c/w ischemic colitis but not UC. I just called patient and told him that he does not have ulcerative colitis based on re-review by the pathologist. What he has is ischemic colitis. He will stop Rowasa and prednisone. I sent cipro and flagyl 550m BID for 2weeks to his pharmacy. We will arrange for urgent CT Angio abdomen as I don't have a good explanation for the etiology of ischemic colitis. Pt voiced understanding of the plan.  RCephas Darby MD [address] [place] [address] Main: [phone] Fax: [phone]: [phone]

## 2017-02-20 NOTE — Progress Notes (Addendum)
Cephas Darby, MD [address]  [place]  [place], [place] [address]  Main: [phone]  Fax: [phone]    Gastroenterology Consultation  Referring Provider:     No ref. provider found Primary Care Physician:  Patient, No Pcp Per Primary Gastroenterologist:  Dr. Cephas Darby Reason for Consultation: Proctitis, Hospital follow-up        HPI:   Ethan Jackson is a 41 y.o. Spanish-speaking male with recent diagnosis of proctitis here as a hospital follow-up  Hospital summary 02/07/2017 to 02/10/2017: Several episodes of fresh blood per rectum associated with some clots and severe lower abdominal pain and unable to urinate. In the ER he was found to have significant leukocytosis, CT abdomen/pelvis revealed significant rectosigmoid thickening concerning inflammatory bowel disease. He reports that he has had intermittent rectal bleeding for the last 1-2 years but not as severe as the current episode. He did not have abdominal pain in the past. He had low-grade fevers. He denies any recent travel to [place] or outside [place]. He denies any other GI symptoms including nausea, vomiting, loss of appetite or weight loss or other extraintestinal manifestations. Workup was negative for C. difficile and other GI pathogens, HIV nonreactive. CRP elevated to 9.4. 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 However, given the colonoscopy findings I started him on prednisone and Rowasa enemas and he noticed improvement in his symptoms prior to discharge.  Follow-up visit 10/20/2016 He is currently on prednisone 40 mg daily and able to tolerate Rowasa enemas twice daily. He is having 2-3 small and formed bowel movements daily. Denies any rectal bleeding. He has been experiencing abdominal bloating and mild lower abdominal discomfort. He has also noticed new  onset of left thigh pain like a muscle ache. He denies nausea, vomiting, fever, chills.  NSAIDs: None  Antiplts/Anticoagulants/Anti thrombotics: None  Denies any family history of inflammatory bowel disease or other GI conditions including malignancy He did not have abdominal surgeries or any GI surgeries in the past  SH: He denies taking NSAIDs, denies tobacco or alcohol use or any other illicit drug use. He is married, and has a daughter. He works as a Nature conservation officer. Unknown HIV status. He denies recent antibiotic use.   GI Procedures: Colonoscopy 02/08/2017 Findings: - The examined portion of the ileum was normal.  Biopsied.  - The sigmoid colon, descending colon, transverse colon, ascending colon and cecum are normal.  Biopsied.  - Altered vascular, congested, friable (with contact bleeding), inflamed and ulcerated mucosa from anus to rectosigmoid upto 30 cm proximal to the anus, features c/w IBD.  Biopsied, also to r/o CMV, HSV.   DIAGNOSIS:  A. TERMINAL ILEUM; BIOPSIES:  - SMALL BOWEL MUCOSA WITH INTACT VILLOUS ARCHITECTURE.  - NEGATIVE FOR INTRAEPITHELIAL LYMPHOCYTOSIS, DYSPLASIA AND MALIGNANCY.   B. RIGHT COLON; BIOPSIES:  - COLONIC MUCOSA NEGATIVE FOR MICROSCOPIC COLITIS, DYSPLASIA AND  MALIGNANCY.   C. TRANSVERSE COLON; BIOPSIES:  - COLONIC MUCOSA NEGATIVE FOR MICROSCOPIC COLITIS, DYSPLASIA AND  MALIGNANCY.   D. LEFT AND SIGMOID COLON; BIOPSIES:  - COLONIC MUCOSA NEGATIVE FOR MICROSCOPIC COLITIS, DYSPLASIA AND  MALIGNANCY.   E. RECTUM; BIOPSIES:  - CHANGES SUGGESTIVE OF HEALING ULCERATION/EROSION.  - NEGATIVE FOR DYSPLASIA AND MALIGNANCY.   Past Medical History:  Diagnosis Date  . Motion sickness    amusement park rides    Past Surgical History:  Procedure Laterality Date  .  COLONOSCOPY N/A 02/08/2017   Procedure: COLONOSCOPY;  Surgeon: Lin Landsman, MD;  Location: [facility] ENDOSCOPY;  Service: Gastroenterology;  Laterality: N/A;  .  ETHMOIDECTOMY Bilateral 08/17/2015   Procedure: ANT/POSTERIOR ETHMOIDECTOMY;  Surgeon: Beverly Gust, MD;  Location: [facility];  Service: ENT;  Laterality: Bilateral;  . FRONTAL SINUS EXPLORATION Bilateral 08/17/2015   Procedure: FRONTAL SINUS EXPLORATION;  Surgeon: Beverly Gust, MD;  Location: [facility];  Service: ENT;  Laterality: Bilateral;  . IMAGE GUIDED SINUS SURGERY N/A 08/17/2015   Procedure: IMAGE GUIDED SINUS SURGERY;  Surgeon: Beverly Gust, MD;  Location: [facility];  Service: ENT;  Laterality: N/A;  . MAXILLARY ANTROSTOMY Bilateral 08/17/2015   Procedure: MAXILLARY ANTROSTOMY;  Surgeon: Beverly Gust, MD;  Location: [facility];  Service: ENT;  Laterality: Bilateral;  . NASAL TURBINATE REDUCTION Bilateral 08/17/2015   Procedure: TURBINATE REDUCTION/SUBMUCOSAL RESECTION;  Surgeon: Beverly Gust, MD;  Location: [facility];  Service: ENT;  Laterality: Bilateral;  . NO PAST SURGERIES    . SEPTOPLASTY N/A 08/17/2015   Procedure: SEPTOPLASTY;  Surgeon: Beverly Gust, MD;  Location: [facility];  Service: ENT;  Laterality: N/A;  DISK GAVE DISK TO TABITHA ON 4-06 KP  . SPHENOIDECTOMY Bilateral 08/17/2015   Procedure: SPHENOIDECTOMY;  Surgeon: Beverly Gust, MD;  Location: [facility];  Service: ENT;  Laterality: Bilateral;    Prior to Admission medications   Medication Sig Start Date End Date Taking? Authorizing Provider  acetaminophen (TYLENOL) 325 MG tablet Take 2 tablets (650 mg total) by mouth every 6 (six) hours as needed for mild pain (or Fever >/= 101). 02/10/17   Nicholes Mango, MD  mesalamine (ROWASA) 4 g enema Place 60 mLs (4 g total) rectally 2 (two) times daily. 02/10/17   Gouru, Illene Silver, MD  predniSONE (DELTASONE) 10 MG tablet Take 4 tablets (40 mg total) by mouth daily. Prednisone 40 mg by mouth once daily for 12 days Followed by 30 mg for 1 week Followed by 20 mg for 1 week Followed by 10 mg for 1 week and  stop 02/10/17 02/22/17  Nicholes Mango, MD    No family history on file.   Social History  Substance Use Topics  . Smoking status: Never Smoker  . Smokeless tobacco: Never Used  . Alcohol use No    Allergies as of 02/20/2017  . (No Known Allergies)    Review of Systems:    All systems reviewed and negative except where noted in HPI.   Physical Exam:  BP (!) 149/83   Pulse 76   Ht 5' 9"  (1.753 m)   Wt 193 lb 12.8 oz (87.9 kg)   BMI 28.62 kg/m  No LMP for male patient.  General:   Alert,  Well-developed, well-nourished, pleasant and cooperative in NAD Head:  Normocephalic and atraumatic. Eyes:  Sclera clear, no icterus.   Conjunctiva pink. Ears:  Normal auditory acuity. Nose:  No deformity, discharge, or lesions. Mouth:  No deformity or lesions,oropharynx pink & moist. Neck:  Supple; no masses or thyromegaly. Lungs:  Respirations even and unlabored.  Clear throughout to auscultation.   No wheezes, crackles, or rhonchi. No acute distress. Heart:  Regular rate and rhythm; no murmurs, clicks, rubs, or gallops. Abdomen:  Normal bowel sounds. Soft, mild lower abdominal tenderness and non-distended without masses, hepatosplenomegaly or hernias noted.  No guarding or rebound tenderness.   Rectal: Nor performed Msk:  Symmetrical without gross deformities. Good, equal movement & strength bilaterally. Pulses:  Normal pulses noted. Extremities:  No clubbing or edema.  No cyanosis. Neurologic:  Alert and oriented x3;  grossly normal neurologically. Skin:  Intact without significant lesions or rashes. No jaundice. Lymph Nodes:  No significant cervical adenopathy. Psych:  Alert and cooperative. Normal mood and affect.  Imaging Studies: Reviewed  Assessment and Plan:   Ethan Jackson is a 41 y.o. male, Spanish-speaking with mild to moderate proctitis based on colonoscopy, features suggestive of ulcerative proctitis but pathology not consistent with it. However, he responded  clinically to prednisone and mesalamine enemas. Therefore, at this time I recommend him to continue mesalamine enema, decrease to once daily and start prednisone taper today to 20 mg for one week followed by 10 mg for another week. His muscle ache is probably secondary to steroid use. I will review the slides with the pathologist one more time. I recommend flexible sigmoidoscopy in next 3-6 months to reassess inflammation. He has QuantiFERON gold came back as indeterminate. He reported that he had PPD skin test performed in 2017 and it was negative.   Follow up in 2 months   Cephas Darby, MD

## 2017-02-23 ENCOUNTER — Other Ambulatory Visit: Payer: Self-pay | Admitting: Gastroenterology

## 2017-02-23 ENCOUNTER — Telehealth: Payer: Self-pay

## 2017-02-23 ENCOUNTER — Other Ambulatory Visit: Payer: Self-pay

## 2017-02-23 DIAGNOSIS — K559 Vascular disorder of intestine, unspecified: Secondary | ICD-10-CM

## 2017-02-23 NOTE — Telephone Encounter (Signed)
-----   Message from Lin Landsman, MD sent at 02/20/2017  3:38 PM EDT ----- Regarding: CT angio abdomen  Griffin Dewilde   Please schedule urgent CT Angio abdomen. I just spoke to him over phone and told him that he does not have ulcerative colitis based on re-review by the pathologist. What he has is ischemic colitis. He will stop Rowasa and prednisone. Plz send cipro and flagyl 555m BID for 2weeks to his pharmacy.   Thanks RV

## 2017-02-23 NOTE — Telephone Encounter (Signed)
Patient has been informed of appt for CT Abd at [facility] located at [place].  Date  Monday 03/02/17  Instructed pt he may only have liquids 4 hours prior to having CT at 1:15pm.    He has picked up his rx from pharmacy.  Thanks [employer]

## 2017-02-24 ENCOUNTER — Other Ambulatory Visit: Payer: Self-pay

## 2017-02-26 ENCOUNTER — Telehealth: Payer: Self-pay | Admitting: Gastroenterology

## 2017-02-26 NOTE — Telephone Encounter (Signed)
Manuela Schwartz with Radiology Scheduling needs a CT Scan order faxed to [phone] or [phone]

## 2017-02-27 ENCOUNTER — Other Ambulatory Visit: Payer: Self-pay

## 2017-02-27 DIAGNOSIS — R1084 Generalized abdominal pain: Secondary | ICD-10-CM

## 2017-03-02 ENCOUNTER — Ambulatory Visit
Admission: RE | Admit: 2017-03-02 | Discharge: 2017-03-02 | Disposition: A | Discharge status: Home / Self Care | Payer: Self-pay | Source: Ambulatory Visit | Attending: Gastroenterology | Admitting: Gastroenterology

## 2017-03-02 DIAGNOSIS — K639 Disease of intestine, unspecified: Secondary | ICD-10-CM | POA: Insufficient documentation

## 2017-03-02 DIAGNOSIS — R1084 Generalized abdominal pain: Secondary | ICD-10-CM | POA: Insufficient documentation

## 2017-03-02 MED ORDER — IOPAMIDOL (ISOVUE-370) INJECTION 76%
100.0000 mL | Freq: Once | INTRAVENOUS | Status: AC | PRN
Start: 2017-03-02 — End: 2017-03-02
  Administered 2017-03-02: 100 mL via INTRAVENOUS

## 2017-03-04 ENCOUNTER — Telehealth: Payer: Self-pay

## 2017-03-04 NOTE — Telephone Encounter (Signed)
Pts mailbox is full will keep trying to contact him with his results.  Thanks [employer]

## 2017-03-04 NOTE — Telephone Encounter (Signed)
-----   Message from Lin Landsman, MD sent at 03/02/2017  3:54 PM EST ----- Please notify patient that his CAT scan came back unremarkable.  He still needs to finish 2-week course of antibiotics that were prescribed.  I recommend follow-up flex sig in 6 months.  -RV

## 2017-03-05 ENCOUNTER — Telehealth: Payer: Self-pay

## 2017-03-05 NOTE — Telephone Encounter (Signed)
No voice mail set up to leave a call back message.  Thanks [employer]

## 2017-03-05 NOTE — Telephone Encounter (Signed)
-----   Message from Lin Landsman, MD sent at 03/02/2017  3:54 PM EST ----- Please notify patient that his CAT scan came back unremarkable.  He still needs to finish 2-week course of antibiotics that were prescribed.  I recommend follow-up flex sig in 6 months.  -RV

## 2017-03-06 ENCOUNTER — Telehealth: Payer: Self-pay

## 2017-03-06 NOTE — Telephone Encounter (Signed)
Pt has been notified his CT Scan came back unremarkable, advised to complete antibiotics as prescribed.  Follow up with [place] in 3 months.  Appt is scheduled.  Thanks [employer]

## 2017-04-30 ENCOUNTER — Other Ambulatory Visit: Payer: Self-pay

## 2017-05-04 ENCOUNTER — Other Ambulatory Visit: Payer: Self-pay

## 2017-05-04 ENCOUNTER — Ambulatory Visit: Payer: Self-pay | Admitting: Gastroenterology

## 2019-04-05 IMAGING — CT CT RENAL STONE PROTOCOL
3 of 4 series · 9 of 46 positions shown, 16 images · non-contrast
Comparison: None.

CLINICAL DATA: LEFT abdominal and pelvic pain, rectal bleeding.

EXAM:
CT ABDOMEN AND PELVIS WITHOUT CONTRAST
TECHNIQUE: Multidetector CT imaging of the abdomen and pelvis was performed
following the standard protocol without IV contrast.

[Series 4: lung bases. · axial · 0.74mm/px · z∈[-187,-107]mm · 5 of 25 slices shown, 10 images]
[im 5/25  soft-tissue]
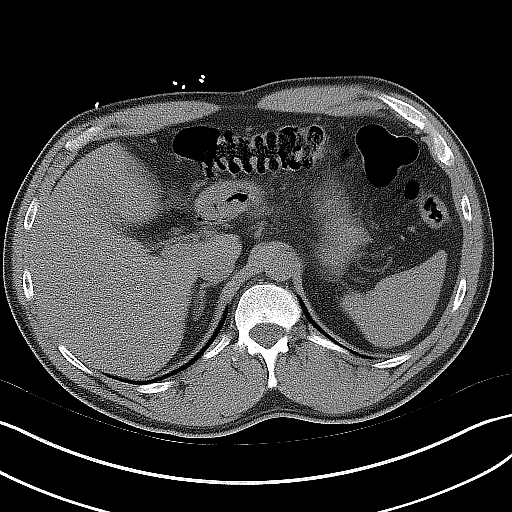
[im 5/25  bone]
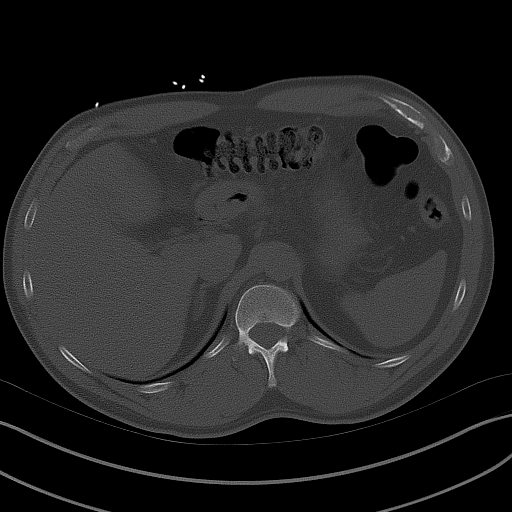
[im 9/25  soft-tissue]
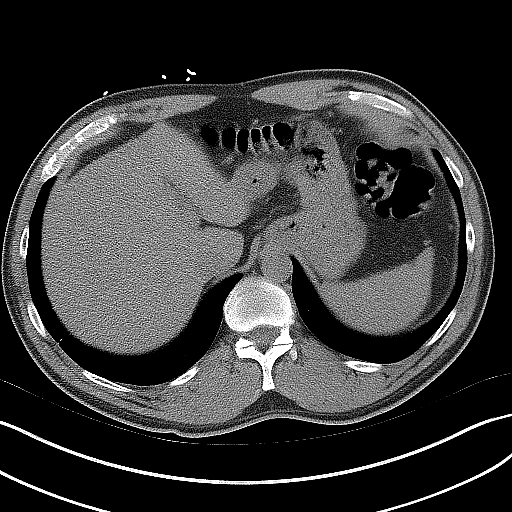
[im 9/25  lung]
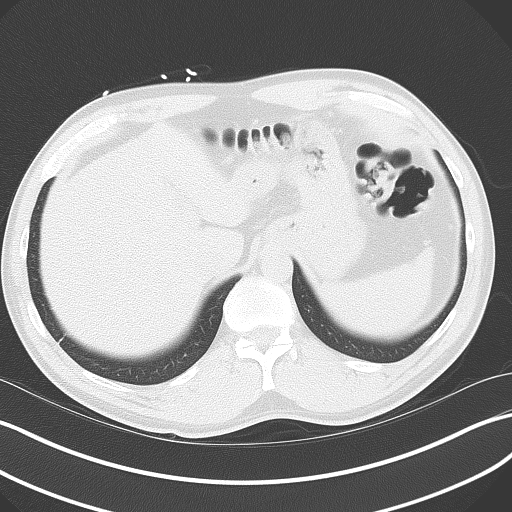
[im 13/25  soft-tissue]
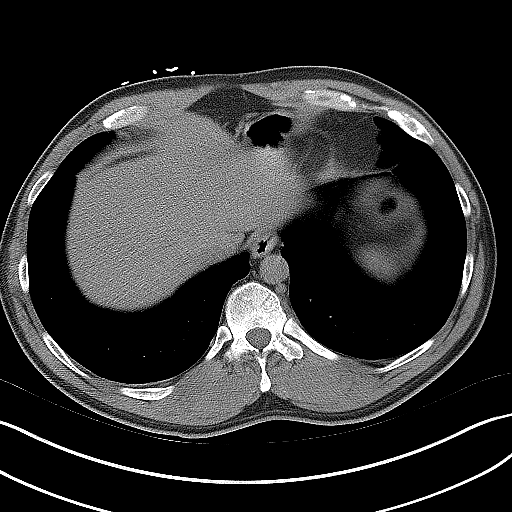
[im 13/25  lung]
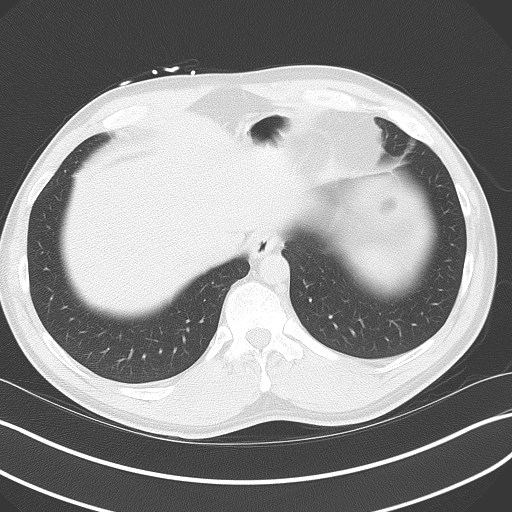
[im 17/25  soft-tissue]
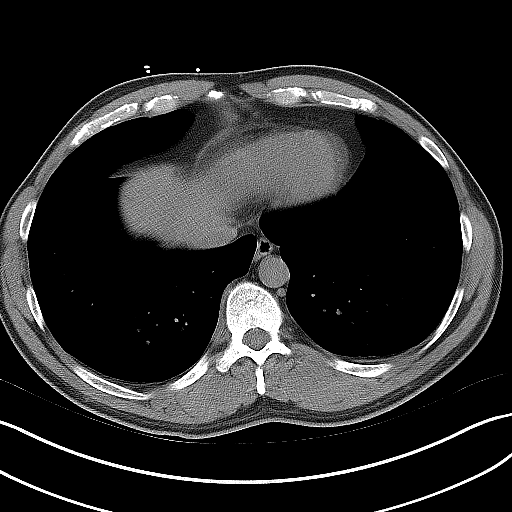
[im 17/25  lung]
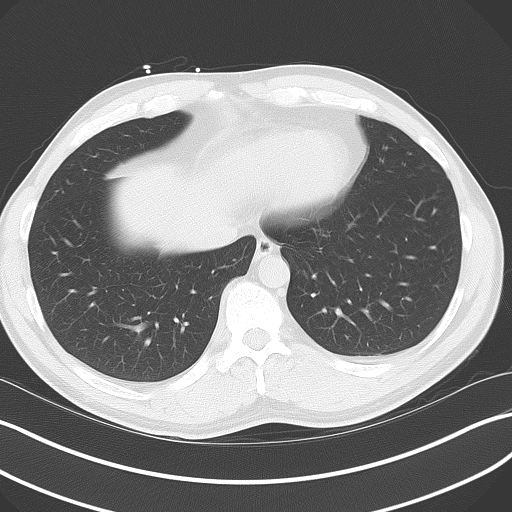
[im 21/25  soft-tissue]
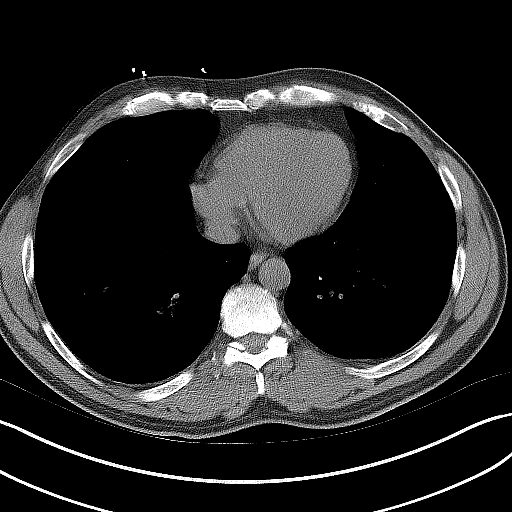
[im 21/25  lung]
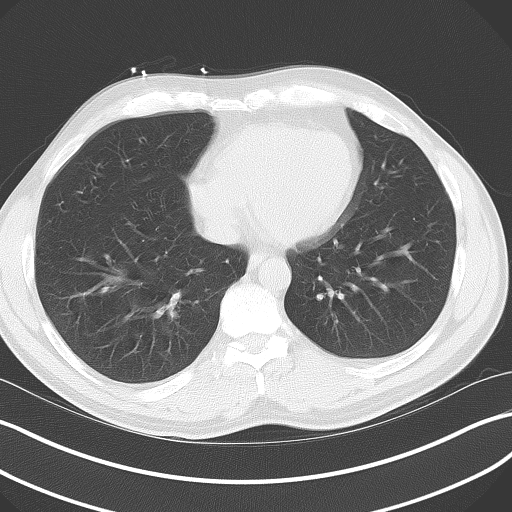

[Series 5: coronal · coronal · 0.77mm/px · 3 of 134 slices shown, 4 images]
[im 45/134  soft-tissue]
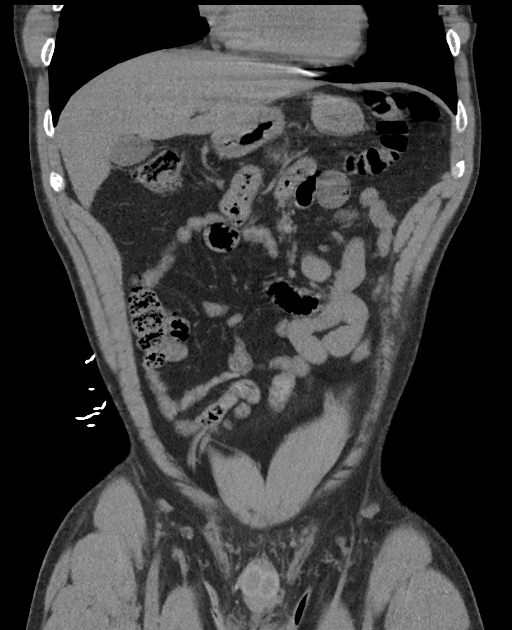
[im 60/134  soft-tissue]
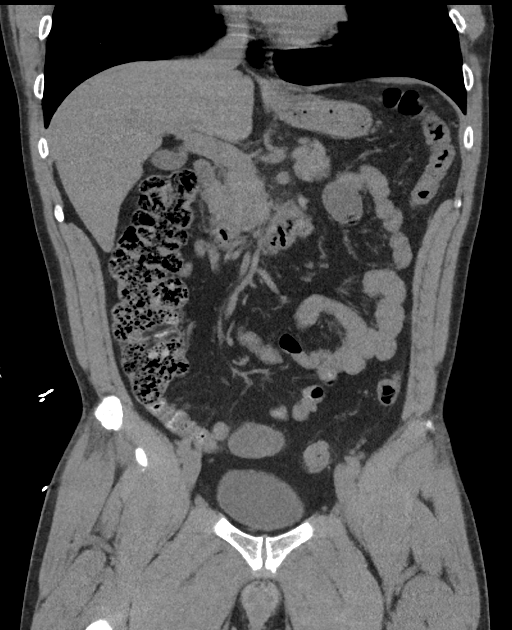
[im 60/134  bone]
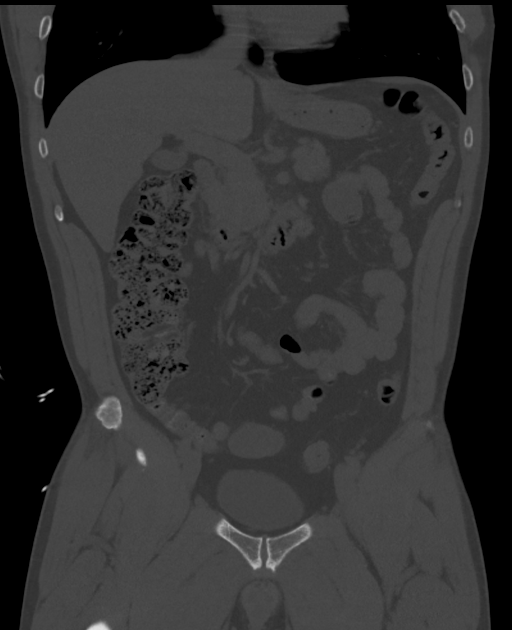
[im 74/134  soft-tissue]
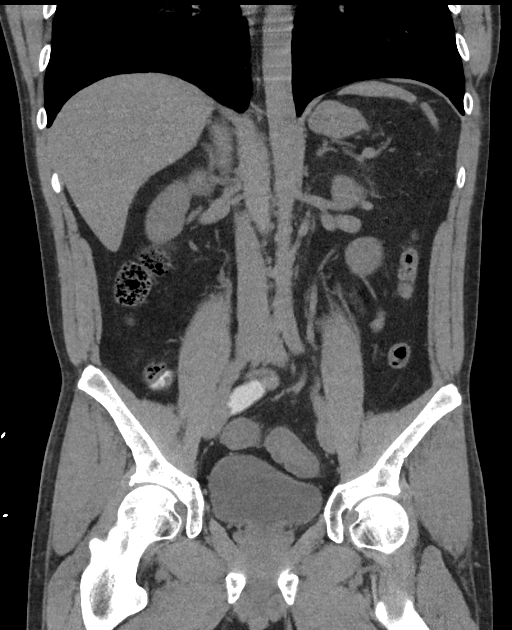

[Series 6: sagittal · sagittal · 0.57mm/px · 1 of 183 slices shown, 2 images]
[im 61/183  soft-tissue]
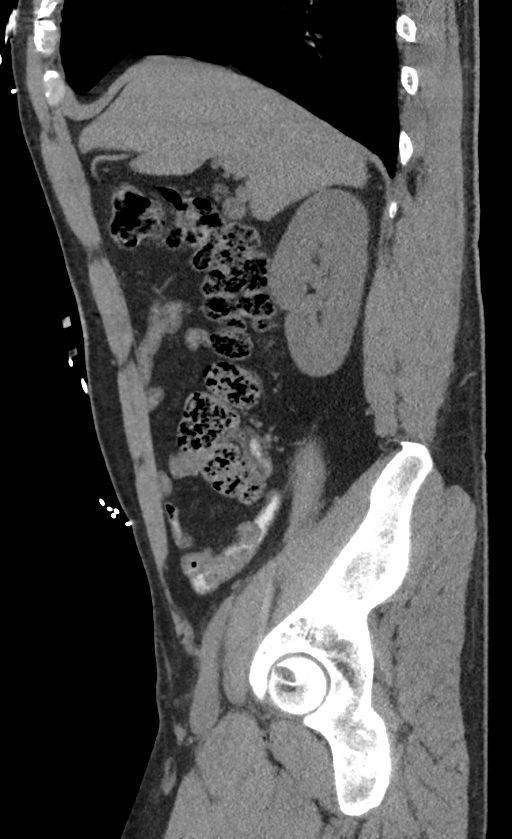
[im 61/183  bone]
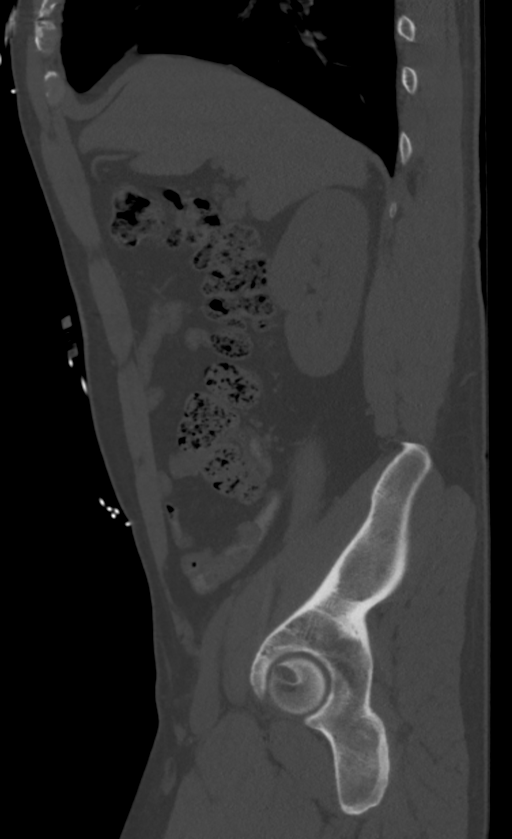

[9 of 46 positions shown; findings below may reference images not displayed]

FINDINGS: LOWER CHEST: Lung bases are clear. The visualized heart size is
normal. No pericardial effusion.

HEPATOBILIARY: Normal.

PANCREAS: Normal.

SPLEEN: Punctate splenic granuloma, spleen is otherwise
unremarkable.

ADRENALS/URINARY TRACT: Kidneys are orthotopic, demonstrating normal
size and morphology. No nephrolithiasis, hydronephrosis; limited
assessment for renal masses on this nonenhanced examination. The
unopacified ureters are normal in course and caliber. Urinary
bladder is partially distended and unremarkable. Normal adrenal
glands.

STOMACH/BOWEL: Rectosigmoid marked circumferential wall thickening
and pericolonic inflammation. The stomach, small bowel are normal in
course and caliber without inflammatory changes, sensitivity
decreased by lack of enteric contrast. A few suspected small bowel
diverticulum. Normal appendix.

VASCULAR/LYMPHATIC: Aortoiliac vessels are normal in course and
caliber. No lymphadenopathy by CT size criteria.

REPRODUCTIVE: Normal.

OTHER: No intraperitoneal free fluid or free air. Presacral fat
stranding.

MUSCULOSKELETAL: Non-acute. Small fat containing umbilical hernia.
Mild broad levoscoliosis. L5 limbus vertebra. Scattered old
Schmorl's nodes.
IMPRESSION: Severe rectosigmoid colitis seen with inflammatory bowel disease,
less likely infectious etiology.

## 2019-08-15 ENCOUNTER — Other Ambulatory Visit: Payer: Self-pay

## 2019-08-16 ENCOUNTER — Other Ambulatory Visit: Payer: Self-pay

## 2019-08-16 ENCOUNTER — Ambulatory Visit (INDEPENDENT_AMBULATORY_CARE_PROVIDER_SITE_OTHER): Payer: 59 | Admitting: Gastroenterology

## 2019-08-16 ENCOUNTER — Encounter: Payer: Self-pay | Admitting: Gastroenterology

## 2019-08-16 VITALS — BP 155/84 | HR 76 | Temp 98.2°F | Ht 69.0 in | Wt 202.1 lb

## 2019-08-16 DIAGNOSIS — K625 Hemorrhage of anus and rectum: Secondary | ICD-10-CM | POA: Diagnosis not present

## 2019-08-16 DIAGNOSIS — K51519 Left sided colitis with unspecified complications: Secondary | ICD-10-CM

## 2019-08-16 DIAGNOSIS — R140 Abdominal distension (gaseous): Secondary | ICD-10-CM | POA: Diagnosis not present

## 2019-08-16 NOTE — Progress Notes (Signed)
Cephas Darby, MD [address]  [place]  [place], [place] [address]  Main: [phone]  Fax: [phone]    Gastroenterology Consultation  Referring Provider:     No ref. provider found Primary Care Physician:  Patient, No Pcp Per Primary Gastroenterologist:  Dr. Cephas Darby Reason for Consultation: Proctitis, Hospital follow-up        HPI:   Ethan Jackson is a 44 y.o. Spanish-speaking male with recent diagnosis of proctitis here as a hospital follow-up  Hospital summary 02/07/2017 to 02/10/2017: Several episodes of fresh blood per rectum associated with some clots and severe lower abdominal pain and unable to urinate. In the ER he was found to have significant leukocytosis, CT abdomen/pelvis revealed significant rectosigmoid thickening concerning inflammatory bowel disease. He reports that he has had intermittent rectal bleeding for the last 1-2 years but not as severe as the current episode. He did not have abdominal pain in the past. He had low-grade fevers. He denies any recent travel to [place] or outside [place]. He denies any other GI symptoms including nausea, vomiting, loss of appetite or weight loss or other extraintestinal manifestations. Workup was negative for C. difficile and other GI pathogens, HIV nonreactive. CRP elevated to 9.4. 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 However, given the colonoscopy findings I started him on prednisone and Rowasa enemas and he noticed improvement in his symptoms prior to discharge.  Follow-up visit 10/20/2016 He is currently on prednisone 40 mg daily and able to tolerate Rowasa enemas twice daily. He is having 2-3 small and formed bowel movements daily. Denies any rectal bleeding. He has been experiencing abdominal bloating and mild lower abdominal discomfort. He has also noticed new  onset of left thigh pain like a muscle ache. He denies nausea, vomiting, fever, chills.  Follow-up visit 08/16/2019 Patient is here to discuss about his GI symptoms including abdominal bloating, lower abdominal discomfort, rectal bleeding.  He was last seen in 2018.  He had inflammation in his left colon on colonoscopy.  However, there was no evidence of chronic colitis based on pathology.  CT angio did not reveal any evidence of mesenteric ischemia.  Patient reports that for the last 1 year or so, he has been experiencing lower abdominal pain associated with rectal bleeding, particularly when he has a bowel movement.  He reports his bowel movements are sometimes hard.  He does not drink sodas.  He said he has been drinking alcohol regularly in the midst of COVID-19 pandemic, stress at work and unable to go out  NSAIDs: None  Antiplts/Anticoagulants/Anti thrombotics: None  Denies any family history of inflammatory bowel disease or other GI conditions including malignancy He did not have abdominal surgeries or any GI surgeries in the past  SH: He denies taking NSAIDs, denies tobacco or alcohol use or any other illicit drug use. He is married, and has a son and daughter. He is a Educational psychologist. HIV negative. He denies recent antibiotic use.   GI Procedures: Colonoscopy 02/08/2017 Findings: - The examined portion of the ileum was normal.  Biopsied.  - The sigmoid colon, descending colon, transverse colon, ascending colon and cecum are normal.  Biopsied.  - Altered vascular, congested, friable (with contact bleeding), inflamed and ulcerated mucosa from anus to rectosigmoid upto 30 cm proximal to the anus, features c/w IBD.  Biopsied, also to r/o CMV, HSV.   DIAGNOSIS:  A. TERMINAL ILEUM; BIOPSIES:  - SMALL BOWEL MUCOSA WITH INTACT VILLOUS ARCHITECTURE.  - NEGATIVE FOR INTRAEPITHELIAL LYMPHOCYTOSIS, DYSPLASIA AND MALIGNANCY.   B. RIGHT COLON; BIOPSIES:  - COLONIC MUCOSA NEGATIVE FOR  MICROSCOPIC COLITIS, DYSPLASIA AND  MALIGNANCY.   C. TRANSVERSE COLON; BIOPSIES:  - COLONIC MUCOSA NEGATIVE FOR MICROSCOPIC COLITIS, DYSPLASIA AND  MALIGNANCY.   D. LEFT AND SIGMOID COLON; BIOPSIES:  - COLONIC MUCOSA NEGATIVE FOR MICROSCOPIC COLITIS, DYSPLASIA AND  MALIGNANCY.   E. RECTUM; BIOPSIES:  - CHANGES SUGGESTIVE OF HEALING ULCERATION/EROSION.  - NEGATIVE FOR DYSPLASIA AND MALIGNANCY.   Past Medical History:  Diagnosis Date  . Motion sickness    amusement park rides    Past Surgical History:  Procedure Laterality Date  . COLONOSCOPY N/A 02/08/2017   Procedure: COLONOSCOPY;  Surgeon: Lin Landsman, MD;  Location: [facility] ENDOSCOPY;  Service: Gastroenterology;  Laterality: N/A;  . ETHMOIDECTOMY Bilateral 08/17/2015   Procedure: ANT/POSTERIOR ETHMOIDECTOMY;  Surgeon: Beverly Gust, MD;  Location: [place];  Service: ENT;  Laterality: Bilateral;  . FRONTAL SINUS EXPLORATION Bilateral 08/17/2015   Procedure: FRONTAL SINUS EXPLORATION;  Surgeon: Beverly Gust, MD;  Location: [place];  Service: ENT;  Laterality: Bilateral;  . IMAGE GUIDED SINUS SURGERY N/A 08/17/2015   Procedure: IMAGE GUIDED SINUS SURGERY;  Surgeon: Beverly Gust, MD;  Location: [place];  Service: ENT;  Laterality: N/A;  . MAXILLARY ANTROSTOMY Bilateral 08/17/2015   Procedure: MAXILLARY ANTROSTOMY;  Surgeon: Beverly Gust, MD;  Location: [place];  Service: ENT;  Laterality: Bilateral;  . NASAL TURBINATE REDUCTION Bilateral 08/17/2015   Procedure: TURBINATE REDUCTION/SUBMUCOSAL RESECTION;  Surgeon: Beverly Gust, MD;  Location: [place];  Service: ENT;  Laterality: Bilateral;  . NO PAST SURGERIES    . SEPTOPLASTY N/A 08/17/2015   Procedure: SEPTOPLASTY;  Surgeon: Beverly Gust, MD;  Location: [place];  Service: ENT;  Laterality: N/A;  DISK GAVE DISK TO TABITHA ON 4-06 KP  . SPHENOIDECTOMY Bilateral 08/17/2015   Procedure:  SPHENOIDECTOMY;  Surgeon: Beverly Gust, MD;  Location: [facility] [facility];  Service: ENT;  Laterality: Bilateral;    Current Outpatient Medications:  .  cetirizine (ZYRTEC) 10 MG tablet, Take by mouth., Disp: , Rfl:  .  fluticasone (FLONASE) 50 MCG/ACT nasal spray, Place into the nose., Disp: , Rfl:    History reviewed. No pertinent family history.   Social History   Tobacco Use  . Smoking status: Never Smoker  . Smokeless tobacco: Never Used  Substance Use Topics  . Alcohol use: Yes    Alcohol/week: 20.0 standard drinks    Types: 20 Standard drinks or equivalent per week    Comment: 2 a day and 4-5 on weekends   . Drug use: No    Allergies as of 08/16/2019  . (No Known Allergies)    Review of Systems:    All systems reviewed and negative except where noted in HPI.   Physical Exam:  BP (!) 155/84 (BP Location: Left Arm, Patient Position: Sitting, Cuff Size: Normal)   Pulse 76   Temp 98.2 F (36.8 C) (Oral)   Ht 5' 9"  (1.753 m)   Wt 202 lb 2 oz (91.7 kg)   BMI 29.85 kg/m  No LMP for male patient.  General:   Alert,  Well-developed, well-nourished, pleasant and cooperative in NAD Head:  Normocephalic and atraumatic. Eyes:  Sclera clear, no icterus.   Conjunctiva pink. Ears:  Normal auditory acuity. Nose:  No deformity, discharge, or lesions. Mouth:  No  deformity or lesions,oropharynx pink & moist. Neck:  Supple; no masses or thyromegaly. Lungs:  Respirations even and unlabored.  Clear throughout to auscultation.   No wheezes, crackles, or rhonchi. No acute distress. Heart:  Regular rate and rhythm; no murmurs, clicks, rubs, or gallops. Abdomen:  Normal bowel sounds. Soft, moderately distended, tympanic to percussion, mild lower abdominal tenderness without masses, hepatosplenomegaly or hernias noted.  No guarding or rebound tenderness.   Rectal: Nor performed Msk:  Symmetrical without gross deformities. Good, equal movement & strength bilaterally. Pulses:   Normal pulses noted. Extremities:  No clubbing or edema.  No cyanosis. Neurologic:  Alert and oriented x3;  grossly normal neurologically. Skin:  Intact without significant lesions or rashes. No jaundice. Psych:  Alert and cooperative. Normal mood and affect.  Imaging Studies: Reviewed  Assessment and Plan:   Ethan Jackson is a 44 y.o. male, Spanish-speaking with mild to moderate proctitis based on colonoscopy, features suggestive of ulcerative proctitis but pathology not consistent with it. However, he responded clinically to prednisone and mesalamine enemas.  After reviewing colon and rectal biopsies with the pathologist again, possibility of ischemic colitis was raised.  CT angio was unremarkable.  Given his ongoing symptoms of rectal bleeding, abdominal bloating and lower extremity discomfort, I recommend flexible sigmoidoscopy with biopsies  Also recommend H. pylori breath test given his ethnicity and history of abdominal bloating   Follow up in 2 months   Cephas Darby, MD

## 2019-08-18 LAB — H. PYLORI BREATH TEST: H pylori Breath Test: NEGATIVE

## 2019-08-31 ENCOUNTER — Other Ambulatory Visit
Admission: RE | Admit: 2019-08-31 | Discharge: 2019-08-31 | Disposition: A | Discharge status: Home / Self Care | Payer: 59 | Source: Ambulatory Visit | Attending: Gastroenterology | Admitting: Gastroenterology

## 2019-08-31 ENCOUNTER — Other Ambulatory Visit: Payer: Self-pay

## 2019-08-31 DIAGNOSIS — Z20822 Contact with and (suspected) exposure to covid-19: Secondary | ICD-10-CM | POA: Insufficient documentation

## 2019-08-31 DIAGNOSIS — Z01812 Encounter for preprocedural laboratory examination: Secondary | ICD-10-CM | POA: Diagnosis not present

## 2019-08-31 LAB — SARS CORONAVIRUS 2 (TAT 6-24 HRS): SARS Coronavirus 2: NEGATIVE

## 2019-09-02 ENCOUNTER — Encounter
Admission: RE | Disposition: A | Discharge status: Home / Self Care | Payer: Self-pay | Source: Home / Self Care | Attending: Gastroenterology

## 2019-09-02 ENCOUNTER — Ambulatory Visit: Payer: 59 | Admitting: Anesthesiology

## 2019-09-02 ENCOUNTER — Other Ambulatory Visit: Payer: Self-pay

## 2019-09-02 ENCOUNTER — Ambulatory Visit
Admission: RE | Admit: 2019-09-02 | Discharge: 2019-09-02 | Disposition: A | Discharge status: Home / Self Care | Payer: 59 | Attending: Gastroenterology | Admitting: Gastroenterology

## 2019-09-02 ENCOUNTER — Encounter: Payer: Self-pay | Admitting: Gastroenterology

## 2019-09-02 DIAGNOSIS — K51519 Left sided colitis with unspecified complications: Secondary | ICD-10-CM

## 2019-09-02 DIAGNOSIS — Z79899 Other long term (current) drug therapy: Secondary | ICD-10-CM | POA: Diagnosis not present

## 2019-09-02 DIAGNOSIS — D125 Benign neoplasm of sigmoid colon: Secondary | ICD-10-CM | POA: Insufficient documentation

## 2019-09-02 DIAGNOSIS — K635 Polyp of colon: Secondary | ICD-10-CM | POA: Diagnosis not present

## 2019-09-02 DIAGNOSIS — K921 Melena: Secondary | ICD-10-CM | POA: Insufficient documentation

## 2019-09-02 DIAGNOSIS — K559 Vascular disorder of intestine, unspecified: Secondary | ICD-10-CM | POA: Diagnosis not present

## 2019-09-02 HISTORY — PX: FLEXIBLE SIGMOIDOSCOPY: SHX5431

## 2019-09-02 SURGERY — SIGMOIDOSCOPY, FLEXIBLE
Anesthesia: General

## 2019-09-02 MED ORDER — FENTANYL CITRATE (PF) 100 MCG/2ML IJ SOLN
INTRAMUSCULAR | Status: DC | PRN
  Administered 2019-09-02: 50 ug via INTRAVENOUS

## 2019-09-02 MED ORDER — FENTANYL CITRATE (PF) 100 MCG/2ML IJ SOLN
INTRAMUSCULAR | Status: AC
  Filled 2019-09-02: qty 2

## 2019-09-02 MED ORDER — MIDAZOLAM HCL 2 MG/2ML IJ SOLN
INTRAMUSCULAR | Status: DC | PRN
  Administered 2019-09-02: 2 mg via INTRAVENOUS

## 2019-09-02 MED ORDER — MIDAZOLAM HCL 2 MG/2ML IJ SOLN
INTRAMUSCULAR | Status: AC
  Filled 2019-09-02: qty 2

## 2019-09-02 MED ORDER — PROPOFOL 500 MG/50ML IV EMUL
INTRAVENOUS | Status: DC | PRN
  Administered 2019-09-02: 150 ug/kg/min via INTRAVENOUS

## 2019-09-02 MED ORDER — EPHEDRINE 5 MG/ML INJ
INTRAVENOUS | Status: AC
  Filled 2019-09-02: qty 10

## 2019-09-02 MED ORDER — SODIUM CHLORIDE 0.9 % IV SOLN: INTRAVENOUS | Status: DC

## 2019-09-02 MED ORDER — EPHEDRINE SULFATE 50 MG/ML IJ SOLN
INTRAMUSCULAR | Status: DC | PRN
  Administered 2019-09-02: 10 mg via INTRAVENOUS

## 2019-09-02 MED ORDER — PROPOFOL 500 MG/50ML IV EMUL
INTRAVENOUS | Status: AC
  Filled 2019-09-02: qty 50

## 2019-09-02 NOTE — H&P (Signed)
Ethan Darby, MD [address]  [place]  [place], [place] [address]  Main: [phone]  Fax: [phone] Pager: [phone]  Primary Care Physician:  Patient, No Pcp Per Primary Gastroenterologist:  Dr. Cephas Jackson  Pre-Procedure History & Physical: HPI:  Ethan Jackson is a 44 y.o. male is here for an flexible sigmoidoscopy.   Past Medical History:  Diagnosis Date  . Motion sickness    amusement park rides    Past Surgical History:  Procedure Laterality Date  . COLONOSCOPY N/A 02/08/2017   Procedure: COLONOSCOPY;  Surgeon: Ethan Landsman, MD;  Location: [facility] ENDOSCOPY;  Service: Gastroenterology;  Laterality: N/A;  . ETHMOIDECTOMY Bilateral 08/17/2015   Procedure: ANT/POSTERIOR ETHMOIDECTOMY;  Surgeon: Ethan Gust, MD;  Location: [place];  Service: ENT;  Laterality: Bilateral;  . FRONTAL SINUS EXPLORATION Bilateral 08/17/2015   Procedure: FRONTAL SINUS EXPLORATION;  Surgeon: Ethan Gust, MD;  Location: [place];  Service: ENT;  Laterality: Bilateral;  . IMAGE GUIDED SINUS SURGERY N/A 08/17/2015   Procedure: IMAGE GUIDED SINUS SURGERY;  Surgeon: Ethan Gust, MD;  Location: [place];  Service: ENT;  Laterality: N/A;  . MAXILLARY ANTROSTOMY Bilateral 08/17/2015   Procedure: MAXILLARY ANTROSTOMY;  Surgeon: Ethan Gust, MD;  Location: [facility];  Service: ENT;  Laterality: Bilateral;  . NASAL TURBINATE REDUCTION Bilateral 08/17/2015   Procedure: TURBINATE REDUCTION/SUBMUCOSAL RESECTION;  Surgeon: Ethan Gust, MD;  Location: [facility];  Service: ENT;  Laterality: Bilateral;  . NO PAST SURGERIES    . SEPTOPLASTY N/A 08/17/2015   Procedure: SEPTOPLASTY;  Surgeon: Ethan Gust, MD;  Location: [place];  Service: ENT;  Laterality: N/A;  DISK GAVE DISK TO Ethan Jackson ON 4-06 KP  . SPHENOIDECTOMY Bilateral 08/17/2015   Procedure: SPHENOIDECTOMY;  Surgeon: Ethan Gust, MD;   Location: [place];  Service: ENT;  Laterality: Bilateral;    Prior to Admission medications   Medication Sig Start Date End Date Taking? Authorizing Provider  cetirizine (ZYRTEC) 10 MG tablet Take by mouth.   Yes [provider]  fluticasone (FLONASE) 50 MCG/ACT nasal spray Place into the nose. 08/18/18  Yes [provider]    Allergies as of 08/16/2019  . (No Known Allergies)    History reviewed. No pertinent family history.  Social History   Socioeconomic History  . Marital status: Married    Spouse name: Not on file  . Number of children: Not on file  . Years of education: Not on file  . Highest education level: Not on file  Occupational History  . Not on file  Tobacco Use  . Smoking status: Never Smoker  . Smokeless tobacco: Never Used  Substance and Sexual Activity  . Alcohol use: Yes    Alcohol/week: 20.0 standard drinks    Types: 20 Standard drinks or equivalent per week    Comment: 2 a day and 6-8 on weekends   . Drug use: No  . Sexual activity: Yes  Other Topics Concern  . Not on file  Social History Narrative  . Not on file   Social Determinants of Health   Financial Resource Strain:   . Difficulty of Paying Living Expenses:   Food Insecurity:   . Worried About Charity fundraiser in the Last Year:   . Arboriculturist in the Last Year:   Transportation Needs:   . Film/video editor (Medical):   Marland Kitchen Lack of Transportation (Non-Medical):   Physical Activity:   .  Days of Exercise per Week:   . Minutes of Exercise per Session:   Stress:   . Feeling of Stress :   Social Connections:   . Frequency of Communication with Friends and Family:   . Frequency of Social Gatherings with Friends and Family:   . Attends Religious Services:   . Active Member of Clubs or Organizations:   . Attends Archivist Meetings:   Marland Kitchen Marital Status:   Intimate Partner Violence:   . Fear of Current or Ex-Partner:   . Emotionally  Abused:   Marland Kitchen Physically Abused:   . Sexually Abused:     Review of Systems: See HPI, otherwise negative ROS  Physical Exam: BP 127/83   Pulse (!) 56   Temp (!) 97 F (36.1 C) (Temporal)   Resp 18   Ht 5' 9"  (1.753 m)   Wt 91.6 kg   SpO2 100%   BMI 29.83 kg/m  General:   Alert,  pleasant and cooperative in NAD Head:  Normocephalic and atraumatic. Neck:  Supple; no masses or thyromegaly. Lungs:  Clear throughout to auscultation.    Heart:  Regular rate and rhythm. Abdomen:  Soft, nontender and nondistended. Normal bowel sounds, without guarding, and without rebound.   Neurologic:  Alert and  oriented x4;  grossly normal neurologically.  Impression/Plan: Ethan Jackson is here for an flexible sigmoidoscopy to be performed for h/o ischemic colitis  Risks, benefits, limitations, and alternatives regarding  flexible sigmoidoscopy have been reviewed with the patient.  Questions have been answered.  All parties agreeable.   Sherri Sear, MD  09/02/2019, 10:53 AM

## 2019-09-02 NOTE — Anesthesia Postprocedure Evaluation (Signed)
Anesthesia Post Note  Patient: Ethan Jackson  Procedure(s) Performed: FLEXIBLE SIGMOIDOSCOPY (N/A )  Patient location during evaluation: Endoscopy Anesthesia Type: General Level of consciousness: awake and alert Pain management: pain level controlled Vital Signs Assessment: post-procedure vital signs reviewed and stable Respiratory status: spontaneous breathing, nonlabored ventilation, respiratory function stable and patient connected to nasal cannula oxygen Cardiovascular status: blood pressure returned to baseline and stable Postop Assessment: no apparent nausea or vomiting Anesthetic complications: no     Last Vitals:  Vitals:   09/02/19 1215 09/02/19 1225  BP: 94/67 116/74  Pulse: 70   Resp: 16   Temp: (!) 35.9 C   SpO2: 98%     Last Pain:  Vitals:   09/02/19 1235  TempSrc:   PainSc: 0-No pain                 Martha Clan

## 2019-09-02 NOTE — Anesthesia Procedure Notes (Signed)
Performed by: Cook-Martin, Rocquel Askren Pre-anesthesia Checklist: Patient identified, Emergency Drugs available, Suction available, Patient being monitored and Timeout performed Patient Re-evaluated:Patient Re-evaluated prior to induction Oxygen Delivery Method: Nasal cannula Preoxygenation: Pre-oxygenation with 100% oxygen Induction Type: IV induction Placement Confirmation: positive ETCO2 and CO2 detector       

## 2019-09-02 NOTE — Op Note (Addendum)
[facility] Patient Name: Ethan Jackson Procedure Date: 09/02/2019 11:52 AM MRN: [id number] Account #: [id number] Date of Birth: 1976/02/09 Admit Type: Outpatient Age: 44 Room: [facility] ENDO ROOM 3 Gender: Male Note Status: Finalized Procedure:             Flexible Sigmoidoscopy Indications:           Hematochezia, Follow-up of ischemic colitis Providers:             Lin Landsman MD, MD Referring MD:          No Local Md, MD (Referring MD) Medicines:             Monitored Anesthesia Care Complications:         No immediate complications. Estimated blood loss: None. Procedure:             Pre-Anesthesia Assessment:                        - Prior to the procedure, a History and Physical was                         performed, and patient medications and allergies were                         reviewed. The patient is competent. The risks and                         benefits of the procedure and the sedation options and                         risks were discussed with the patient. All questions                         were answered and informed consent was obtained.                         Patient identification and proposed procedure were                         verified by the physician, the nurse, the                         anesthesiologist, the anesthetist and the technician                         in the pre-procedure area in the procedure room in the                         endoscopy suite. Mental Status Examination: alert and                         oriented. Airway Examination: normal oropharyngeal                         airway and neck mobility. Respiratory Examination:                         clear to auscultation. CV Examination: normal.  Prophylactic Antibiotics: The patient does not require                         prophylactic antibiotics. Prior Anticoagulants: The                         patient has taken  no previous anticoagulant or                         antiplatelet agents. ASA Grade Assessment: II - A                         patient with mild systemic disease. After reviewing                         the risks and benefits, the patient was deemed in                         satisfactory condition to undergo the procedure. The                         anesthesia plan was to use monitored anesthesia care                         (MAC). Immediately prior to administration of                         medications, the patient was re-assessed for adequacy                         to receive sedatives. The heart rate, respiratory                         rate, oxygen saturations, blood pressure, adequacy of                         pulmonary ventilation, and response to care were                         monitored throughout the procedure. The physical                         status of the patient was re-assessed after the                         procedure.                        After obtaining informed consent, the scope was passed                         under direct vision. The Endoscope was introduced                         through the anus and advanced to the the splenic                         flexure. The flexible sigmoidoscopy was accomplished  without difficulty. The patient tolerated the                         procedure well. The quality of the bowel preparation                         was adequate. Findings:      The perianal and digital rectal examinations were normal. Pertinent       negatives include normal sphincter tone and no palpable rectal lesions.      Normal mucosa was found in the rectum, in the sigmoid colon and in the       descending colon.      A 6 mm, non-bleeding polyp was found in the sigmoid colon. The polyp was       semi-pedunculated. The polyp was removed with a hot snare. Resection and       retrieval were complete. Impression:            -  Normal mucosa in the rectum, in the sigmoid colon                         and in the descending colon.                        - One 6 mm, non-bleeding polyp in the sigmoid colon,                         removed with a hot snare. Resected and retrieved. Recommendation:        - Discharge patient to home (with escort).                        - Resume regular diet today.                        - Await pathology results. Procedure Code(s):     --- Professional ---                        [id number], Sigmoidoscopy, flexible; with removal of                         tumor(s), polyp(s), or other lesion(s) by snare                         technique Diagnosis Code(s):     --- Professional ---                        K92.1, Melena (includes Hematochezia)                        K63.5, Polyp of colon                        K55.9, Vascular disorder of intestine, unspecified CPT copyright 2019 American Medical Association. All rights reserved. The codes documented in this report are preliminary and upon coder review may  be revised to meet current compliance requirements. Dr. Ulyess Mort Lin Landsman MD, MD 09/02/2019 12:14:23 PM This report has been signed electronically. Number of Addenda: 0 Note Initiated On: 09/02/2019 11:52 AM Total Procedure Duration:  0 hours 8 minutes 9 seconds  Estimated Blood Loss:  Estimated blood loss: none.      [facility]

## 2019-09-02 NOTE — Transfer of Care (Signed)
Immediate Anesthesia Transfer of Care Note  Patient: Ethan Jackson  Procedure(s) Performed: FLEXIBLE SIGMOIDOSCOPY (N/A )  Patient Location: PACU  Anesthesia Type:General  Level of Consciousness: awake and sedated  Airway & Oxygen Therapy: Patient Spontanous Breathing and Patient connected to nasal cannula oxygen  Post-op Assessment: Report given to RN and Post -op Vital signs reviewed and stable  Post vital signs: Reviewed and stable  Last Vitals:  Vitals Value Taken Time  BP    Temp    Pulse    Resp    SpO2      Last Pain:  Vitals:   09/02/19 1048  TempSrc: Temporal  PainSc: 0-No pain         Complications: No apparent anesthesia complications

## 2019-09-02 NOTE — Anesthesia Preprocedure Evaluation (Signed)
Anesthesia Evaluation  Patient identified by MRN, date of birth, ID band Patient awake    Reviewed: Allergy & Precautions, NPO status , Patient's Chart, lab work & pertinent test results, reviewed documented beta blocker date and time   History of Anesthesia Complications Negative for: history of anesthetic complications  Airway Mallampati: II  TM Distance: >3 FB     Dental  (+) Chipped   Pulmonary neg pulmonary ROS,           Cardiovascular Exercise Tolerance: Good negative cardio ROS       Neuro/Psych negative neurological ROS     GI/Hepatic negative GI ROS, Neg liver ROS,   Endo/Other  negative endocrine ROS  Renal/GU negative Renal ROS     Musculoskeletal   Abdominal   Peds  Hematology   Anesthesia Other Findings Past Medical History: No date: Motion sickness     Comment:  amusement park rides   Reproductive/Obstetrics                             Anesthesia Physical  Anesthesia Plan  ASA: II  Anesthesia Plan: General   Post-op Pain Management:    Induction: Intravenous  PONV Risk Score and Plan: 2 and Propofol infusion and TIVA  Airway Management Planned: Natural Airway and Nasal Cannula  Additional Equipment:   Intra-op Plan:   Post-operative Plan:   Informed Consent: I have reviewed the patients History and Physical, chart, labs and discussed the procedure including the risks, benefits and alternatives for the proposed anesthesia with the patient or authorized representative who has indicated his/her understanding and acceptance.       Plan Discussed with: CRNA  Anesthesia Plan Comments:         Anesthesia Quick Evaluation

## 2019-09-05 ENCOUNTER — Telehealth: Payer: Self-pay

## 2019-09-05 ENCOUNTER — Encounter: Payer: Self-pay | Admitting: Gastroenterology

## 2019-09-05 LAB — SURGICAL PATHOLOGY

## 2019-09-05 NOTE — Telephone Encounter (Signed)
-----   Message from Lin Landsman, MD sent at 09/05/2019  4:32 PM EDT ----- Please inform patient that the pathology results from recent procedure, polyp removed came back as benign.  I recommend colonoscopy in 5 yearsRohini Vanga

## 2019-09-05 NOTE — Telephone Encounter (Signed)
Patient verbalized understanding  

## 2019-09-20 ENCOUNTER — Encounter: Payer: Self-pay | Admitting: *Deleted

## 2019-11-01 ENCOUNTER — Ambulatory Visit: Payer: 59 | Admitting: Gastroenterology
